# Patient Record
Sex: Female | Born: 1985
Health system: Southern US, Community
[De-identification: ages and names within clinical notes are randomized; demographics above are authoritative.]

## PROBLEM LIST (undated history)

## (undated) ENCOUNTER — Inpatient Hospital Stay (HOSPITAL_COMMUNITY): Payer: Self-pay

## (undated) DIAGNOSIS — N390 Urinary tract infection, site not specified: Secondary | ICD-10-CM

## (undated) DIAGNOSIS — K219 Gastro-esophageal reflux disease without esophagitis: Secondary | ICD-10-CM

## (undated) DIAGNOSIS — IMO0002 Reserved for concepts with insufficient information to code with codable children: Secondary | ICD-10-CM

## (undated) DIAGNOSIS — O139 Gestational [pregnancy-induced] hypertension without significant proteinuria, unspecified trimester: Secondary | ICD-10-CM

## (undated) DIAGNOSIS — T148XXA Other injury of unspecified body region, initial encounter: Secondary | ICD-10-CM

## (undated) DIAGNOSIS — R51 Headache: Secondary | ICD-10-CM

## (undated) DIAGNOSIS — R011 Cardiac murmur, unspecified: Secondary | ICD-10-CM

## (undated) HISTORY — PX: OTHER SURGICAL HISTORY: SHX169

---

## 2005-12-04 ENCOUNTER — Ambulatory Visit (HOSPITAL_COMMUNITY): Admission: RE | Admit: 2005-12-04 | Discharge: 2005-12-04 | Payer: Self-pay | Admitting: Family Medicine

## 2005-12-10 ENCOUNTER — Ambulatory Visit (HOSPITAL_COMMUNITY): Admission: RE | Admit: 2005-12-10 | Discharge: 2005-12-10 | Payer: Self-pay | Admitting: Family Medicine

## 2006-02-11 ENCOUNTER — Encounter (HOSPITAL_COMMUNITY): Admission: RE | Admit: 2006-02-11 | Discharge: 2006-03-13 | Payer: Self-pay | Admitting: Neurosurgery

## 2006-03-16 ENCOUNTER — Encounter (HOSPITAL_COMMUNITY): Admission: RE | Admit: 2006-03-16 | Discharge: 2006-04-15 | Payer: Self-pay | Admitting: Neurosurgery

## 2006-10-01 ENCOUNTER — Emergency Department (HOSPITAL_COMMUNITY): Admission: EM | Admit: 2006-10-01 | Discharge: 2006-10-01 | Payer: Self-pay | Admitting: Emergency Medicine

## 2007-06-15 ENCOUNTER — Ambulatory Visit (HOSPITAL_COMMUNITY): Admission: RE | Admit: 2007-06-15 | Discharge: 2007-06-15 | Payer: Self-pay | Admitting: *Deleted

## 2007-07-04 ENCOUNTER — Emergency Department (HOSPITAL_COMMUNITY): Admission: EM | Admit: 2007-07-04 | Discharge: 2007-07-04 | Payer: Self-pay | Admitting: Emergency Medicine

## 2008-09-02 ENCOUNTER — Emergency Department (HOSPITAL_COMMUNITY): Admission: EM | Admit: 2008-09-02 | Discharge: 2008-09-02 | Payer: Self-pay | Admitting: Emergency Medicine

## 2010-02-23 ENCOUNTER — Encounter: Payer: Self-pay | Admitting: Family Medicine

## 2010-05-09 ENCOUNTER — Other Ambulatory Visit (HOSPITAL_COMMUNITY): Payer: Self-pay | Admitting: Emergency Medicine

## 2010-05-09 ENCOUNTER — Ambulatory Visit (HOSPITAL_COMMUNITY)
Admission: RE | Admit: 2010-05-09 | Discharge: 2010-05-09 | Disposition: A | Discharge status: Home / Self Care | Payer: 59 | Source: Ambulatory Visit | Attending: Emergency Medicine | Admitting: Emergency Medicine

## 2010-05-09 DIAGNOSIS — S99919A Unspecified injury of unspecified ankle, initial encounter: Secondary | ICD-10-CM | POA: Insufficient documentation

## 2010-05-09 DIAGNOSIS — X500XXA Overexertion from strenuous movement or load, initial encounter: Secondary | ICD-10-CM | POA: Insufficient documentation

## 2010-05-09 DIAGNOSIS — S8990XA Unspecified injury of unspecified lower leg, initial encounter: Secondary | ICD-10-CM | POA: Insufficient documentation

## 2010-05-09 DIAGNOSIS — S99912A Unspecified injury of left ankle, initial encounter: Secondary | ICD-10-CM

## 2010-05-09 DIAGNOSIS — M25579 Pain in unspecified ankle and joints of unspecified foot: Secondary | ICD-10-CM | POA: Insufficient documentation

## 2010-05-11 LAB — MONONUCLEOSIS SCREEN: Mono Screen: NEGATIVE

## 2010-05-11 LAB — RAPID STREP SCREEN (MED CTR MEBANE ONLY): Streptococcus, Group A Screen (Direct): NEGATIVE

## 2010-10-29 LAB — RPR: RPR: NONREACTIVE

## 2010-10-29 LAB — ABO/RH: RH Type: POSITIVE

## 2010-10-29 LAB — HEPATITIS B SURFACE ANTIGEN: Hepatitis B Surface Ag: NEGATIVE

## 2010-10-29 LAB — ANTIBODY SCREEN: Antibody Screen: NEGATIVE

## 2011-04-16 ENCOUNTER — Encounter (HOSPITAL_COMMUNITY): Payer: Self-pay | Admitting: *Deleted

## 2011-04-16 ENCOUNTER — Inpatient Hospital Stay (HOSPITAL_COMMUNITY)
Admission: AD | Admit: 2011-04-16 | Discharge: 2011-04-16 | Disposition: A | Discharge status: Home / Self Care | Payer: 59 | Source: Ambulatory Visit | Attending: Obstetrics and Gynecology | Admitting: Obstetrics and Gynecology

## 2011-04-16 DIAGNOSIS — I10 Essential (primary) hypertension: Secondary | ICD-10-CM

## 2011-04-16 DIAGNOSIS — O139 Gestational [pregnancy-induced] hypertension without significant proteinuria, unspecified trimester: Secondary | ICD-10-CM | POA: Insufficient documentation

## 2011-04-16 HISTORY — DX: Reserved for concepts with insufficient information to code with codable children: IMO0002

## 2011-04-16 HISTORY — DX: Gestational (pregnancy-induced) hypertension without significant proteinuria, unspecified trimester: O13.9

## 2011-04-16 LAB — CBC
Hemoglobin: 10.9 g/dL — ABNORMAL LOW (ref 12.0–15.0)
MCHC: 32.7 g/dL (ref 30.0–36.0)

## 2011-04-16 LAB — COMPREHENSIVE METABOLIC PANEL
ALT: 14 U/L (ref 0–35)
AST: 11 U/L (ref 0–37)
Albumin: 2.3 g/dL — ABNORMAL LOW (ref 3.5–5.2)
Alkaline Phosphatase: 85 U/L (ref 39–117)
GFR calc Af Amer: >90 mL/min (ref >90)
Glucose, Bld: 113 mg/dL — ABNORMAL HIGH (ref 70–99)
Potassium: 3.4 mEq/L — ABNORMAL LOW (ref 3.5–5.1)
Sodium: 133 mEq/L — ABNORMAL LOW (ref 135–145)
Total Protein: 6 g/dL (ref 6.0–8.3)

## 2011-04-16 NOTE — MAU Note (Addendum)
rtn visit in office, BP higher today. Was up last wk, had bloodwork for pre-eclampsia done then.  Sent from office.  Denies headache, visual changes, epigastric pain,has noted an increase in swelling in feet.

## 2011-04-16 NOTE — MAU Note (Signed)
Dr. Henderson Cloud notified of pt's lab results, efm tracing reactive, bp's wnl, orders to d/c home, f/u in office Friday or Monday. Pt to be out of work on modified bedrest.

## 2011-04-16 NOTE — Discharge Instructions (Signed)
Dr. Henderson Cloud wants you to be out of work on Moderate bedrest, lying on your left side.      Normal Labor and Delivery Your caregiver must first be sure you are in labor. Signs of labor include:  You may pass what is called "the mucus plug" before labor begins. This is a small amount of blood stained mucus.   Regular uterine contractions.   The time between contractions get closer together.   The discomfort and pain gradually gets more intense.   Pains are mostly located in the back.   Pains get worse when walking.   The cervix (the opening of the uterus becomes thinner (begins to efface) and opens up (dilates).  Once you are in labor and admitted into the hospital or care center, your caregiver will do the following:  A complete physical examination.   Check your vital signs (blood pressure, pulse, temperature and the fetal heart rate).   Do a vaginal examination (using a sterile glove and lubricant) to determine:   The position (presentation) of the baby (head [vertex] or buttock first).   The level (station) of the baby's head in the birth canal.   The effacement and dilatation of the cervix.   You may have your pubic hair shaved and be given an enema depending on your caregiver and the circumstance.   An electronic monitor is usually placed on your abdomen. The monitor follows the length and intensity of the contractions, as well as the baby's heart rate.   Usually, your caregiver will insert an IV in your arm with a bottle of sugar water. This is done as a precaution so that medications can be given to you quickly during labor or delivery.  NORMAL LABOR AND DELIVERY IS DIVIDED UP INTO 3 STAGES: First Stage This is when regular contractions begin and the cervix begins to efface and dilate. This stage can last from 3 to 15 hours. The end of the first stage is when the cervix is 100% effaced and 10 centimeters dilated. Pain medications may be given by   Injection  (morphine, demerol, etc.)   Regional anesthesia (spinal, caudal or epidural, anesthetics given in different locations of the spine). Paracervical pain medication may be given, which is an injection of and anesthetic on each side of the cervix.  A pregnant woman may request to have "Natural Childbirth" which is not to have any medications or anesthesia during her labor and delivery. Second Stage This is when the baby comes down through the birth canal (vagina) and is born. This can take 1 to 4 hours. As the baby's head comes down through the birth canal, you may feel like you are going to have a bowel movement. You will get the urge to bear down and push until the baby is delivered. As the baby's head is being delivered, the caregiver will decide if an episiotomy (a cut in the perineum and vagina area) is needed to prevent tearing of the tissue in this area. The episiotomy is sewn up after the delivery of the baby and placenta. Sometimes a mask with nitrous oxide is given for the mother to breath during the delivery of the baby to help if there is too much pain. The end of Stage 2 is when the baby is fully delivered. Then when the umbilical cord stops pulsating it is clamped and cut. Third Stage The third stage begins after the baby is completely delivered and ends after the placenta (afterbirth) is delivered. This usually  takes 5 to 30 minutes. After the placenta is delivered, a medication is given either by intravenous or injection to help contract the uterus and prevent bleeding. The third stage is not painful and pain medication is usually not necessary. If an episiotomy was done, it is repaired at this time. After the delivery, the mother is watched and monitored closely for 1 to 2 hours to make sure there is no postpartum bleeding (hemorrhage). If there is a lot of bleeding, medication is given to contract the uterus and stop the bleeding. Document Released: 10/29/2007 Document Revised: 01/08/2011  Document Reviewed: 10/29/2007 [facility] Patient Information 2012 [place], [place].Hypertension During Pregnancy Hypertension is also called high blood pressure. It can occur at any time in life and during pregnancy. When you have hypertension, there is extra pressure inside your blood vessels that carry blood from the heart to the rest of your body (arteries). Hypertension during pregnancy can cause problems for you and your baby. Your baby might not weigh as much as it should at birth or might be born early (premature). Very bad cases of hypertension during pregnancy can be life-threatening.  There are different types of hypertension during pregnancy.   Chronic hypertension. This happens when a woman has hypertension before pregnancy and it continues during pregnancy.   Gestational hypertension. This is when hypertension develops during pregnancy.   Preeclampsia or toxemia of pregnancy. This is a very serious type of hypertension that develops only during pregnancy. It is a disease that affects the whole body (systemic) and can be very dangerous for both mother and baby.   Gestational hypertension and preeclampsia usually go away after your baby is born. Blood pressure generally stabilizes within 6 weeks. Women who have hypertension during pregnancy have a greater chance of developing hypertension later in life or with future pregnancies. UNDERSTANDING BLOOD PRESSURE Blood pressure moves blood in your body. Sometimes, the force that moves the blood becomes too strong.  A blood pressure reading is given in 2 numbers and looks like a fraction.   The top number is called the systolic pressure. When your heart beats, it forces more blood to flow through the arteries. Pressure inside the arteries goes up.   The bottom number is the diastolic pressure. Pressure goes down between beats. That is when the heart is resting.   You may have hypertension if:   Your systolic blood pressure is above 140.    Your diastolic pressure is above 90.  RISK FACTORS Some factors make you more likely to develop hypertension during pregnancy. Risk factors include:  Having hypertension before pregnancy.   Having hypertension during a previous pregnancy.   Being overweight.   Being older than 40.   Being pregnant with more than 1 baby (multiples).   Having diabetes or kidney problems.  SYMPTOMS Chronic and gestational hypertension may not cause symptoms. Preeclampsia has symptoms, which may include:  Increased protein in your urine. Your caregiver will check for this at every prenatal visit.   Swelling of your hands and face.   Rapid weight gain.   Headaches.   Visual changes.   Being bothered by light.   Abdominal pain, especially in the right upper area.   Chest pain.   Shortness of breath.   Increased reflexes.   Seizures. Seizures occur with a more severe form of preeclampsia, called eclampsia.  DIAGNOSIS   You may be diagnosed with hypertension during pregnancy during a regular prenatal exam. At each visit, tests may include:   Blood  pressure checks.   A urine test to check for protein in your urine.   The type of hypertension you are diagnosed with depends on when you developed it. It also depends on your specific blood pressure reading.   Developing hypertension before 20 weeks of pregnancy is consistent with chronic hypertension.   Developing hypertension after 20 weeks of pregnancy is consistent with gestational hypertension.   Hypertension with increased urinary protein is diagnosed as preeclampsia.   Blood pressure measurements that stay above 160 systolic or 110 diastolic are a sign of severe preeclampsia.  TREATMENT Treatment for hypertension during pregnancy varies. Treatment depends on the type of hypertension and how serious it is.  If you take medicine for chronic hypertension, you may need to switch medicines.   Drugs called ACE inhibitors should not  be taken during pregnancy.   Low-dose aspirin may be suggested for women who have risk factors for preeclampsia.   If you have gestational hypertension, you may need to take a blood pressure medicine that is safe during pregnancy. Your caregiver will recommend the appropriate medicine.   If you have severe preeclampsia, you may need to be in the hospital. Caregivers will watch you and the baby very closely. You also may need to take medicine (magnesium sulfate) to prevent seizures and lower blood pressure.   Sometimes an early delivery is needed. This may be the case if the condition worsens. It would be done to protect you and the baby. The only cure for preeclampsia is delivery.  HOME CARE INSTRUCTIONS  Schedule and keep all of your regular prenatal care.   Follow your caregiver's instructions for taking medicines. Tell your caregiver about all medicines you take. This includes over-the-counter medicines.   Eat as little salt as possible.   Get regular exercise.   Do not drink alcohol.   Do not use tobacco products.   Do not drink products with caffeine.   Lie on your left side when resting.   Tell your doctor if you have any preeclampsia symptoms.  SEEK IMMEDIATE MEDICAL CARE IF:  You have severe abdominal pain.   You have sudden swelling in the hands, ankles, or face.   You gain 4 pounds (1.8 kg) or more in 1 week.   You vomit repeatedly.   You have vaginal bleeding.   You do not feel the baby moving as much.   You have a headache.   You have blurred or double vision.   You have muscle twitching or spasms.   You have shortness of breath.   You have blue fingernails and lips.   You have blood in your urine.  MAKE SURE YOU:  Understand these instructions.   Will watch your condition.   Will get help right away if you are not doing well.  Document Released: 10/07/2010 Document Revised: 01/08/2011 Document Reviewed: 10/07/2010 [facility] Patient Information  2012 [place], [place].

## 2011-04-16 NOTE — MAU Provider Note (Signed)
History     CSN: [id number]  Arrival date and time: 04/16/11 4098   None     Chief Complaint  Patient presents with  . Hypertension   HPI This is a 26 year old G1 P0 at 83 weeks and one-day who is seen in the MAU for concerns of hypertension and preeclampsia. The patient was evaluated at her office earlier today. The patient admits to tear 4 days of intermittent scotoma and nausea. She is also had increased edema in her lower extremities bilaterally. She denies headache, right upper quadrant pain, decreased fetal activity, vaginal bleeding, vaginal discharge, leaking fluid.  OB History    Grav Para Term Preterm Abortions TAB SAB Ect Mult Living   1               No past medical history on file.  No past surgical history on file.  No family history on file.  History  Substance Use Topics  . Smoking status: Not on file  . Smokeless tobacco: Not on file  . Alcohol Use: Not on file    Allergies: No Known Allergies  Prescriptions prior to admission  Medication Sig Dispense Refill  . OVER THE COUNTER MEDICATION Take 1 tablet by mouth daily. Patient takes Zantac over the counter      . Prenatal Vit-Fe Fumarate-FA (PRENATAL MULTIVITAMIN) TABS Take 1 tablet by mouth daily.        Review of Systems  All other systems reviewed and are negative.   Physical Exam   Blood pressure 170/94, pulse 97, temperature 98.3 F (36.8 C), temperature source Oral, resp. rate 20, weight 182.346 kg (402 lb), last menstrual period 04/17/2010.  Physical Exam  Constitutional: She is oriented to person, place, and time. She appears well-developed and well-nourished.  HENT:  Head: Normocephalic and atraumatic.  Eyes: Conjunctivae and EOM are normal. Pupils are equal, round, and reactive to light.  Neck: Normal range of motion.  Cardiovascular: Normal rate, regular rhythm and normal heart sounds.   Respiratory: Effort normal and breath sounds normal. No respiratory distress. She has no  wheezes. She has no rales. She exhibits no tenderness.  GI: Soft. Bowel sounds are normal. She exhibits no distension and no mass. There is no tenderness. There is no rebound and no guarding.       Obese. Fundal height at term.  Musculoskeletal: She exhibits edema (2+ edema to calf.).  Neurological: She is alert and oriented to person, place, and time.  Skin: Skin is warm and dry.  Psychiatric: She has a normal mood and affect. Her behavior is normal. Judgment and thought content normal.   Lab Results  Component Value Date   WBC 11.3* 04/16/2011   HGB 10.9* 04/16/2011   HCT 33.3* 04/16/2011   MCV 83.9 04/16/2011   PLT 322 04/16/2011   Lab Results  Component Value Date   CREATININE 0.62 04/16/2011   BUN 8 04/16/2011   NA 133* 04/16/2011   K 3.4* 04/16/2011   CL 104 04/16/2011   CO2 19 04/16/2011   Lab Results  Component Value Date   ALT 14 04/16/2011   AST 11 04/16/2011   ALKPHOS 85 04/16/2011   BILITOT 0.2* 04/16/2011   Uric acid 3.3 LDH 133  NST shows baseline rate of 140 with a category 1 tracing including multiple accelerations. There no contractions seen on tocometry.  MAU Course  Procedures  MDM  Assessment and Plan  1.  Hypertension Discussed pt with Dr Henderson Cloud.  Will send patient home  as BP has returned to normal and lab testing normal.  Patient to follow up in 1-2 days at [facility] office.  Will give bedrest and patient to be out of work until released by physician.  Tyrick Dunagan JEHIEL 04/16/2011, 11:37 AM

## 2011-04-16 NOTE — MAU Note (Signed)
Pt seen at MD office this am, elevated bp's. +FM, denies bleeding or lof. Cervix closed in office as well.

## 2011-04-20 ENCOUNTER — Inpatient Hospital Stay (HOSPITAL_COMMUNITY)
Admission: AD | Admit: 2011-04-20 | Discharge: 2011-04-20 | Disposition: A | Discharge status: Home / Self Care | Payer: 59 | Source: Ambulatory Visit | Attending: Obstetrics and Gynecology | Admitting: Obstetrics and Gynecology

## 2011-04-20 ENCOUNTER — Encounter (HOSPITAL_COMMUNITY): Payer: Self-pay | Admitting: *Deleted

## 2011-04-20 DIAGNOSIS — O368190 Decreased fetal movements, unspecified trimester, not applicable or unspecified: Secondary | ICD-10-CM | POA: Insufficient documentation

## 2011-04-20 DIAGNOSIS — O169 Unspecified maternal hypertension, unspecified trimester: Secondary | ICD-10-CM

## 2011-04-20 DIAGNOSIS — O139 Gestational [pregnancy-induced] hypertension without significant proteinuria, unspecified trimester: Secondary | ICD-10-CM | POA: Insufficient documentation

## 2011-04-20 HISTORY — DX: Urinary tract infection, site not specified: N39.0

## 2011-04-20 HISTORY — DX: Cardiac murmur, unspecified: R01.1

## 2011-04-20 HISTORY — DX: Other injury of unspecified body region, initial encounter: T14.8XXA

## 2011-04-20 LAB — COMPREHENSIVE METABOLIC PANEL
Alkaline Phosphatase: 91 U/L (ref 39–117)
BUN: 9 mg/dL (ref 6–23)
CO2: 23 mEq/L (ref 19–32)
Chloride: 103 mEq/L (ref 96–112)
GFR calc Af Amer: >90 mL/min (ref >90)
GFR calc non Af Amer: >90 mL/min (ref >90)
Glucose, Bld: 91 mg/dL (ref 70–99)
Potassium: 4 mEq/L (ref 3.5–5.1)
Total Bilirubin: 0.2 mg/dL — ABNORMAL LOW (ref 0.3–1.2)

## 2011-04-20 LAB — CBC
HCT: 35.6 % — ABNORMAL LOW (ref 36.0–46.0)
Hemoglobin: 11.5 g/dL — ABNORMAL LOW (ref 12.0–15.0)
MCH: 27.4 pg (ref 26.0–34.0)
MCHC: 32.3 g/dL (ref 30.0–36.0)
MCV: 85 fL (ref 78.0–100.0)
Platelets: 334 10*3/uL (ref 150–400)
RBC: 4.19 MIL/uL (ref 3.87–5.11)
RDW: 15.4 % (ref 11.5–15.5)
WBC: 12 10*3/uL — ABNORMAL HIGH (ref 4.0–10.5)

## 2011-04-20 LAB — URIC ACID: Uric Acid, Serum: 3 mg/dL (ref 2.4–7.0)

## 2011-04-20 NOTE — Discharge Instructions (Signed)

## 2011-04-20 NOTE — MAU Note (Signed)
Patient states she was sent from the office for evaluation of elevated blood pressure and decreased fetal movement. Patient states she has a slight headache. Has felt movement, just less than usual for the past 2 days. No bleeding or leaking and no contractions.

## 2011-04-20 NOTE — MAU Provider Note (Signed)
History     CSN: [id number]  Arrival date and time: 04/20/11 1146   First Provider Initiated Contact with Patient 04/20/11 1213      Chief Complaint  Patient presents with  . Hypertension   HPI  Nancy Hayden is 26 y.o. G1P0000 [redacted]w[redacted]d weeks presenting with elevated blood pressure and decreased fetal movement.  She was seen in the office today and sent here for monitoring and PIH labs.  She was also seen last week in MAU for same sxs.  States her blood pressures began being elevated last week.  Dr. Rana Snare asked that the NP see the patient.   Past Medical History  Diagnosis Date  . Pregnancy induced hypertension   . Herniated disc     L4, L5    Past Surgical History  Procedure Date  . No past surgeries     Family History  Problem Relation Age of Onset  . Anesthesia problems Neg Hx   . Hypotension Neg Hx   . Malignant hyperthermia Neg Hx   . Pseudochol deficiency Neg Hx     History  Substance Use Topics  . Smoking status: Not on file  . Smokeless tobacco: Not on file  . Alcohol Use:     Allergies: No Known Allergies  Prescriptions prior to admission  Medication Sig Dispense Refill  . OVER THE COUNTER MEDICATION Take 1 tablet by mouth daily. Patient takes Zantac over the counter      . Prenatal Vit-Fe Fumarate-FA (PRENATAL MULTIVITAMIN) TABS Take 1 tablet by mouth daily.        Review of Systems  Eyes: Negative for blurred vision.  Cardiovascular: Negative for chest pain.       Elevated BP  Genitourinary:       Neg for vaginal bleeding or discharge.  Decreased fetal movement.  Neurological: Negative for headaches.   Physical Exam   Blood pressure 150/96, pulse 85, temperature 97.8 F (36.6 C), temperature source Oral, resp. rate 20, last menstrual period 04/17/2010, SpO2 100.00%.  Temp:  [97.8 F (36.6 C)] 97.8 F (36.6 C) (03/18 1157) Pulse Rate:  [85-106] 96  (03/18 1302) Resp:  [20] 20  (03/18 1228) BP: (136-150)/(66-96) 144/89 mmHg (03/18  1302) SpO2:  [100 %] 100 % (03/18 1157)  Physical Exam  Constitutional: She is oriented to person, place, and time. She appears well-developed and well-nourished. No distress.  HENT:  Head: Normocephalic.  Cardiovascular: Normal rate.   Respiratory: No respiratory distress.  Neurological: She is alert and oriented to person, place, and time.  Skin: Skin is warm and dry.  Psychiatric: She has a normal mood and affect. Her behavior is normal. Judgment and thought content normal.    Results for orders placed during the hospital encounter of 04/20/11 (from the past 24 hour(s))  COMPREHENSIVE METABOLIC PANEL     Status: Abnormal   Collection Time   04/20/11 12:15 PM      Component Value Range   Sodium 136  135 - 145 (mEq/L)   Potassium 4.0  3.5 - 5.1 (mEq/L)   Chloride 103  96 - 112 (mEq/L)   CO2 23  19 - 32 (mEq/L)   Glucose, Bld 91  70 - 99 (mg/dL)   BUN 9  6 - 23 (mg/dL)   Creatinine, Ser 7.82  0.50 - 1.10 (mg/dL)   Calcium 95.6  8.4 - 10.5 (mg/dL)   Total Protein 6.4  6.0 - 8.3 (g/dL)   Albumin 2.5 (*) 3.5 - 5.2 (g/dL)  AST 11  0 - 37 (U/L)   ALT 14  0 - 35 (U/L)   Alkaline Phosphatase 91  39 - 117 (U/L)   Total Bilirubin 0.2 (*) 0.3 - 1.2 (mg/dL)   GFR calc non Af Amer >90  >90 (mL/min)   GFR calc Af Amer >90  >90 (mL/min)  CBC     Status: Abnormal   Collection Time   04/20/11 12:15 PM      Component Value Range   WBC 12.0 (*) 4.0 - 10.5 (K/uL)   RBC 4.19  3.87 - 5.11 (MIL/uL)   Hemoglobin 11.5 (*) 12.0 - 15.0 (g/dL)   HCT 27.2 (*) 53.6 - 46.0 (%)   MCV 85.0  78.0 - 100.0 (fL)   MCH 27.4  26.0 - 34.0 (pg)   MCHC 32.3  30.0 - 36.0 (g/dL)   RDW 64.4  03.4 - 74.2 (%)   Platelets 334  150 - 400 (K/uL)  URIC ACID     Status: Normal   Collection Time   04/20/11 12:15 PM      Component Value Range   Uric Acid, Serum 3.0  2.4 - 7.0 (mg/dL)   MAU Course  Procedures  MDM 13:24  Reported labs and FMS to Dr. Rana Snare.  May discharge to home.  Keep scheduled appt in the office  for 3/21 with Dr. Vincente Poli  Assessment and Plan  A:  Gestational hypertension      PIH ruled out  P:  Keep scheduled appointment in the office for 3/21 with Dr. Vincente Poli.   Reports any further sxs to her doctor.   Angila Wombles,EVE M 04/20/2011, 12:14 PM

## 2011-04-20 NOTE — MAU Note (Signed)
Sent from office, BP elevation. Has been elevated past couple wks.

## 2011-04-28 ENCOUNTER — Encounter (HOSPITAL_COMMUNITY)
Admission: RE | Admit: 2011-04-28 | Discharge: 2011-04-28 | Disposition: A | Discharge status: Home / Self Care | Payer: 59 | Source: Ambulatory Visit | Attending: Obstetrics and Gynecology | Admitting: Obstetrics and Gynecology

## 2011-04-28 ENCOUNTER — Encounter (HOSPITAL_COMMUNITY): Payer: Self-pay

## 2011-04-28 HISTORY — DX: Gastro-esophageal reflux disease without esophagitis: K21.9

## 2011-04-28 HISTORY — DX: Headache: R51

## 2011-04-28 LAB — CBC
MCH: 27.8 pg (ref 26.0–34.0)
MCHC: 32.8 g/dL (ref 30.0–36.0)
RDW: 15.4 % (ref 11.5–15.5)

## 2011-04-28 LAB — SURGICAL PCR SCREEN
MRSA, PCR: NEGATIVE
Staphylococcus aureus: NEGATIVE

## 2011-04-28 LAB — RPR: RPR Ser Ql: NONREACTIVE

## 2011-04-28 NOTE — Patient Instructions (Signed)
YOUR PROCEDURE IS SCHEDULED ON:05/08/11  ENTER THROUGH THE MAIN ENTRANCE OF [facility] AT: 0630 am  USE DESK PHONE AND DIAL [id number] TO INFORM us OF YOUR ARRIVAL  CALL [phone] IF YOU HAVE ANY QUESTIONS OR PROBLEMS PRIOR TO YOUR ARRIVAL.  REMEMBER: DO NOT EAT OR DRINK AFTER MIDNIGHT : Thursday  SPECIAL INSTRUCTIONS:water ok until 4am Friday   YOU MAY BRUSH YOUR TEETH THE MORNING OF SURGERY   TAKE THESE MEDICINES THE DAY OF SURGERY WITH SIP OF WATER:none   DO NOT WEAR JEWELRY, EYE MAKEUP, LIPSTICK OR DARK FINGERNAIL POLISH DO NOT WEAR LOTIONS  DO NOT SHAVE FOR 48 HOURS PRIOR TO SURGERY  YOU WILL NOT BE ALLOWED TO DRIVE YOURSELF HOME.  NAME OF DRIVER: Sharlet Salina

## 2011-04-28 NOTE — OR Nursing (Signed)
This case scheduled at 0800 due to the number of anesthesiologist present this day as the pt is over 40 BMI  surgeon needs an Geophysicist/field seismologist. This case was posted with Dr Roni Bread OK

## 2011-05-05 NOTE — H&P (Addendum)
26 yo G1 @ 39+ weeks presents for primary c-section.  Pt requests primary c-section because of LGA.  Last EFW >4100gms at 38wks.  Good FM.  1hr GTT elevated but 3hr GTT wnl.  Pregnancy uncomplicated.  Past History - See hollister, NKDA  AF, VSS  BP 140-150/90x Gen - NAD Abd - gravid, obese, NT Ext - traced edema bilaterally Cvx 1cm  A/P;  R/b/a to primary c-section vs TOL discussed and informed consent obtained.  Risks discussed again with pt and husband.  Questions answered.  Informed consent

## 2011-05-07 MED ORDER — CEFAZOLIN SODIUM-DEXTROSE 2-3 GM-% IV SOLR
2.0000 g | INTRAVENOUS | Status: AC
  Administered 2011-05-08: 2 g via INTRAVENOUS
  Filled 2011-05-07: qty 50

## 2011-05-08 ENCOUNTER — Encounter (HOSPITAL_COMMUNITY): Payer: Self-pay | Admitting: *Deleted

## 2011-05-08 ENCOUNTER — Inpatient Hospital Stay (HOSPITAL_COMMUNITY)
Admission: RE | Admit: 2011-05-08 | Discharge: 2011-05-11 | DRG: 766 | Disposition: A | Discharge status: Home / Self Care | Payer: 59 | Source: Ambulatory Visit | Attending: Obstetrics and Gynecology | Admitting: Obstetrics and Gynecology

## 2011-05-08 ENCOUNTER — Encounter (HOSPITAL_COMMUNITY)
Admission: RE | Disposition: A | Discharge status: Home / Self Care | Payer: Self-pay | Source: Ambulatory Visit | Attending: Obstetrics and Gynecology

## 2011-05-08 ENCOUNTER — Encounter (HOSPITAL_COMMUNITY): Payer: Self-pay | Admitting: Anesthesiology

## 2011-05-08 ENCOUNTER — Inpatient Hospital Stay (HOSPITAL_COMMUNITY): Payer: 59 | Admitting: Anesthesiology

## 2011-05-08 DIAGNOSIS — E669 Obesity, unspecified: Secondary | ICD-10-CM | POA: Diagnosis present

## 2011-05-08 DIAGNOSIS — O99214 Obesity complicating childbirth: Secondary | ICD-10-CM | POA: Diagnosis present

## 2011-05-08 DIAGNOSIS — O3660X0 Maternal care for excessive fetal growth, unspecified trimester, not applicable or unspecified: Principal | ICD-10-CM | POA: Diagnosis present

## 2011-05-08 SURGERY — Surgical Case
Anesthesia: Spinal | Site: Abdomen | Wound class: Clean

## 2011-05-08 MED ORDER — NALBUPHINE HCL 10 MG/ML IJ SOLN
5.0000 mg | INTRAMUSCULAR | Status: DC | PRN
  Administered 2011-05-08: 5 mg via SUBCUTANEOUS
  Filled 2011-05-08: qty 1

## 2011-05-08 MED ORDER — OXYTOCIN 20 UNITS IN LACTATED RINGERS INFUSION - SIMPLE
INTRAVENOUS | Status: DC | PRN
  Administered 2011-05-08: 20 [IU] via INTRAVENOUS

## 2011-05-08 MED ORDER — SCOPOLAMINE 1 MG/3DAYS TD PT72
MEDICATED_PATCH | TRANSDERMAL | Status: AC
  Administered 2011-05-08: 1.5 mg via TRANSDERMAL
  Filled 2011-05-08: qty 1

## 2011-05-08 MED ORDER — NALBUPHINE HCL 10 MG/ML IJ SOLN
5.0000 mg | INTRAMUSCULAR | Status: DC | PRN
  Filled 2011-05-08: qty 1

## 2011-05-08 MED ORDER — DEXAMETHASONE SODIUM PHOSPHATE 10 MG/ML IJ SOLN
INTRAMUSCULAR | Status: AC
  Filled 2011-05-08: qty 1

## 2011-05-08 MED ORDER — MORPHINE SULFATE 0.5 MG/ML IJ SOLN
INTRAMUSCULAR | Status: AC
  Filled 2011-05-08: qty 10

## 2011-05-08 MED ORDER — KETOROLAC TROMETHAMINE 60 MG/2ML IM SOLN
60.0000 mg | Freq: Once | INTRAMUSCULAR | Status: AC | PRN
  Administered 2011-05-08: 60 mg via INTRAMUSCULAR

## 2011-05-08 MED ORDER — FENTANYL CITRATE 0.05 MG/ML IJ SOLN
INTRAMUSCULAR | Status: DC | PRN
  Administered 2011-05-08: 25 ug via INTRATHECAL

## 2011-05-08 MED ORDER — LANOLIN HYDROUS EX OINT: 1.0000 "application " | TOPICAL_OINTMENT | CUTANEOUS | Status: DC | PRN

## 2011-05-08 MED ORDER — DIPHENHYDRAMINE HCL 50 MG/ML IJ SOLN: 12.5000 mg | INTRAMUSCULAR | Status: DC | PRN

## 2011-05-08 MED ORDER — OXYTOCIN 20 UNITS IN LACTATED RINGERS INFUSION - SIMPLE
INTRAVENOUS | Status: AC
  Administered 2011-05-08: 125 mL/h via INTRAVENOUS
  Filled 2011-05-08: qty 1000

## 2011-05-08 MED ORDER — MEPERIDINE HCL 25 MG/ML IJ SOLN: 6.2500 mg | INTRAMUSCULAR | Status: DC | PRN

## 2011-05-08 MED ORDER — ONDANSETRON HCL 4 MG/2ML IJ SOLN
INTRAMUSCULAR | Status: AC
  Filled 2011-05-08: qty 2

## 2011-05-08 MED ORDER — OXYCODONE-ACETAMINOPHEN 5-325 MG PO TABS
1.0000 | ORAL_TABLET | ORAL | Status: DC | PRN
  Administered 2011-05-09 – 2011-05-11 (×11): 2 via ORAL
  Filled 2011-05-08 (×11): qty 2

## 2011-05-08 MED ORDER — PANTOPRAZOLE SODIUM 40 MG PO TBEC
DELAYED_RELEASE_TABLET | ORAL | Status: AC
  Administered 2011-05-08: 40 mg via ORAL
  Filled 2011-05-08: qty 1

## 2011-05-08 MED ORDER — HYDROMORPHONE HCL PF 1 MG/ML IJ SOLN
INTRAMUSCULAR | Status: AC
  Administered 2011-05-08: 0.5 mg via INTRAVENOUS
  Filled 2011-05-08: qty 1

## 2011-05-08 MED ORDER — EPHEDRINE 5 MG/ML INJ
INTRAVENOUS | Status: AC
  Filled 2011-05-08: qty 10

## 2011-05-08 MED ORDER — DIPHENHYDRAMINE HCL 50 MG/ML IJ SOLN: 25.0000 mg | INTRAMUSCULAR | Status: DC | PRN

## 2011-05-08 MED ORDER — MEDROXYPROGESTERONE ACETATE 150 MG/ML IM SUSP: 150.0000 mg | INTRAMUSCULAR | Status: DC | PRN

## 2011-05-08 MED ORDER — SIMETHICONE 80 MG PO CHEW: 80.0000 mg | CHEWABLE_TABLET | ORAL | Status: DC | PRN

## 2011-05-08 MED ORDER — SENNOSIDES-DOCUSATE SODIUM 8.6-50 MG PO TABS
2.0000 | ORAL_TABLET | Freq: Every day | ORAL | Status: DC
  Administered 2011-05-08 – 2011-05-10 (×3): 2 via ORAL

## 2011-05-08 MED ORDER — MENTHOL 3 MG MT LOZG: 1.0000 | LOZENGE | OROMUCOSAL | Status: DC | PRN

## 2011-05-08 MED ORDER — SODIUM CHLORIDE 0.9 % IJ SOLN: 3.0000 mL | INTRAMUSCULAR | Status: DC | PRN

## 2011-05-08 MED ORDER — ONDANSETRON HCL 4 MG/2ML IJ SOLN
INTRAMUSCULAR | Status: DC | PRN
  Administered 2011-05-08: 4 mg via INTRAVENOUS

## 2011-05-08 MED ORDER — SCOPOLAMINE 1 MG/3DAYS TD PT72
1.0000 | MEDICATED_PATCH | TRANSDERMAL | Status: DC
  Administered 2011-05-08: 1.5 mg via TRANSDERMAL

## 2011-05-08 MED ORDER — OXYTOCIN 20 UNITS IN LACTATED RINGERS INFUSION - SIMPLE
125.0000 mL/h | INTRAVENOUS | Status: AC
  Administered 2011-05-08: 125 mL/h via INTRAVENOUS

## 2011-05-08 MED ORDER — MORPHINE SULFATE (PF) 0.5 MG/ML IJ SOLN
INTRAMUSCULAR | Status: DC | PRN
  Administered 2011-05-08: .15 mg via EPIDURAL

## 2011-05-08 MED ORDER — OXYTOCIN 10 UNIT/ML IJ SOLN
INTRAMUSCULAR | Status: AC
  Filled 2011-05-08: qty 4

## 2011-05-08 MED ORDER — ONDANSETRON HCL 4 MG PO TABS: 4.0000 mg | ORAL_TABLET | ORAL | Status: DC | PRN

## 2011-05-08 MED ORDER — IBUPROFEN 600 MG PO TABS
600.0000 mg | ORAL_TABLET | Freq: Four times a day (QID) | ORAL | Status: DC
  Administered 2011-05-08 – 2011-05-11 (×11): 600 mg via ORAL
  Filled 2011-05-08 (×11): qty 1

## 2011-05-08 MED ORDER — DEXTROSE IN LACTATED RINGERS 5 % IV SOLN
INTRAVENOUS | Status: DC
  Administered 2011-05-08: 16:00:00 via INTRAVENOUS

## 2011-05-08 MED ORDER — ONDANSETRON HCL 4 MG/2ML IJ SOLN: 4.0000 mg | INTRAMUSCULAR | Status: DC | PRN

## 2011-05-08 MED ORDER — TETANUS-DIPHTH-ACELL PERTUSSIS 5-2.5-18.5 LF-MCG/0.5 IM SUSP
0.5000 mL | Freq: Once | INTRAMUSCULAR | Status: AC
  Administered 2011-05-09: 0.5 mL via INTRAMUSCULAR
  Filled 2011-05-08: qty 0.5

## 2011-05-08 MED ORDER — KETOROLAC TROMETHAMINE 30 MG/ML IJ SOLN: 30.0000 mg | Freq: Four times a day (QID) | INTRAMUSCULAR | Status: AC | PRN

## 2011-05-08 MED ORDER — DEXAMETHASONE SODIUM PHOSPHATE 10 MG/ML IJ SOLN
INTRAMUSCULAR | Status: DC | PRN
  Administered 2011-05-08: 10 mg via INTRAVENOUS

## 2011-05-08 MED ORDER — FENTANYL CITRATE 0.05 MG/ML IJ SOLN
INTRAMUSCULAR | Status: AC
  Filled 2011-05-08: qty 2

## 2011-05-08 MED ORDER — KETOROLAC TROMETHAMINE 30 MG/ML IJ SOLN: 15.0000 mg | Freq: Once | INTRAMUSCULAR | Status: DC | PRN

## 2011-05-08 MED ORDER — DIPHENHYDRAMINE HCL 25 MG PO CAPS
25.0000 mg | ORAL_CAPSULE | ORAL | Status: DC | PRN
  Filled 2011-05-08: qty 1

## 2011-05-08 MED ORDER — PHENYLEPHRINE HCL 10 MG/ML IJ SOLN
INTRAMUSCULAR | Status: DC | PRN
  Administered 2011-05-08 (×2): 80 ug via INTRAVENOUS

## 2011-05-08 MED ORDER — KETOROLAC TROMETHAMINE 60 MG/2ML IM SOLN
INTRAMUSCULAR | Status: AC
  Administered 2011-05-08: 60 mg via INTRAMUSCULAR
  Filled 2011-05-08: qty 2

## 2011-05-08 MED ORDER — WITCH HAZEL-GLYCERIN EX PADS: 1.0000 "application " | MEDICATED_PAD | CUTANEOUS | Status: DC | PRN

## 2011-05-08 MED ORDER — ONDANSETRON HCL 4 MG/2ML IJ SOLN: 4.0000 mg | Freq: Three times a day (TID) | INTRAMUSCULAR | Status: DC | PRN

## 2011-05-08 MED ORDER — LACTATED RINGERS IV SOLN
INTRAVENOUS | Status: DC
  Administered 2011-05-08 (×2): via INTRAVENOUS
  Administered 2011-05-08: 125 mL/h via INTRAVENOUS

## 2011-05-08 MED ORDER — NALBUPHINE SYRINGE 5 MG/0.5 ML
INJECTION | INTRAMUSCULAR | Status: AC
  Administered 2011-05-08: 5 mg via SUBCUTANEOUS
  Filled 2011-05-08: qty 0.5

## 2011-05-08 MED ORDER — NALOXONE HCL 0.4 MG/ML IJ SOLN: 0.4000 mg | INTRAMUSCULAR | Status: DC | PRN

## 2011-05-08 MED ORDER — SIMETHICONE 80 MG PO CHEW
80.0000 mg | CHEWABLE_TABLET | Freq: Three times a day (TID) | ORAL | Status: DC
  Administered 2011-05-08 – 2011-05-11 (×11): 80 mg via ORAL

## 2011-05-08 MED ORDER — DIPHENHYDRAMINE HCL 25 MG PO CAPS: 25.0000 mg | ORAL_CAPSULE | Freq: Four times a day (QID) | ORAL | Status: DC | PRN

## 2011-05-08 MED ORDER — PRENATAL MULTIVITAMIN CH
1.0000 | ORAL_TABLET | Freq: Every day | ORAL | Status: DC
  Administered 2011-05-08 – 2011-05-11 (×4): 1 via ORAL
  Filled 2011-05-08 (×4): qty 1

## 2011-05-08 MED ORDER — PROMETHAZINE HCL 25 MG/ML IJ SOLN: 6.2500 mg | INTRAMUSCULAR | Status: DC | PRN

## 2011-05-08 MED ORDER — PANTOPRAZOLE SODIUM 40 MG PO TBEC
40.0000 mg | DELAYED_RELEASE_TABLET | Freq: Once | ORAL | Status: AC
  Administered 2011-05-08: 40 mg via ORAL

## 2011-05-08 MED ORDER — FAMOTIDINE 20 MG PO TABS
20.0000 mg | ORAL_TABLET | Freq: Two times a day (BID) | ORAL | Status: DC
  Administered 2011-05-08 – 2011-05-11 (×5): 20 mg via ORAL
  Filled 2011-05-08 (×5): qty 1

## 2011-05-08 MED ORDER — PHENYLEPHRINE 40 MCG/ML (10ML) SYRINGE FOR IV PUSH (FOR BLOOD PRESSURE SUPPORT)
PREFILLED_SYRINGE | INTRAVENOUS | Status: AC
  Filled 2011-05-08: qty 5

## 2011-05-08 MED ORDER — HYDROMORPHONE HCL PF 1 MG/ML IJ SOLN
0.2500 mg | INTRAMUSCULAR | Status: DC | PRN
  Administered 2011-05-08 (×2): 0.5 mg via INTRAVENOUS

## 2011-05-08 MED ORDER — MEASLES, MUMPS & RUBELLA VAC ~~LOC~~ INJ
0.5000 mL | INJECTION | Freq: Once | SUBCUTANEOUS | Status: DC
  Filled 2011-05-08: qty 0.5

## 2011-05-08 MED ORDER — DIBUCAINE 1 % RE OINT: 1.0000 "application " | TOPICAL_OINTMENT | RECTAL | Status: DC | PRN

## 2011-05-08 MED ORDER — EPHEDRINE SULFATE 50 MG/ML IJ SOLN
INTRAMUSCULAR | Status: DC | PRN
  Administered 2011-05-08 (×2): 10 mg via INTRAVENOUS

## 2011-05-08 MED ORDER — SODIUM CHLORIDE 0.9 % IV SOLN
1.0000 ug/kg/h | INTRAVENOUS | Status: DC | PRN
  Filled 2011-05-08: qty 2.5

## 2011-05-08 SURGICAL SUPPLY — 29 items
CHLORAPREP W/TINT 26ML (MISCELLANEOUS) ×2 IMPLANT
CLOTH BEACON ORANGE TIMEOUT ST (SAFETY) ×2 IMPLANT
DRESSING TELFA 8X3 (GAUZE/BANDAGES/DRESSINGS) ×1 IMPLANT
DRSG COVADERM 4X10 (GAUZE/BANDAGES/DRESSINGS) ×1 IMPLANT
ELECT REM PT RETURN 9FT ADLT (ELECTROSURGICAL) ×2
ELECTRODE REM PT RTRN 9FT ADLT (ELECTROSURGICAL) ×1 IMPLANT
EXTRACTOR VACUUM M CUP 4 TUBE (SUCTIONS) IMPLANT
GLOVE BIO SURGEON STRL SZ 6.5 (GLOVE) ×2 IMPLANT
GLOVE BIOGEL PI IND STRL 7.0 (GLOVE) ×2 IMPLANT
GLOVE BIOGEL PI INDICATOR 7.0 (GLOVE) ×2
GOWN PREVENTION PLUS LG XLONG (DISPOSABLE) ×6 IMPLANT
KIT ABG SYR 3ML LUER SLIP (SYRINGE) ×2 IMPLANT
NDL HYPO 25X5/8 SAFETYGLIDE (NEEDLE) ×1 IMPLANT
NEEDLE HYPO 25X5/8 SAFETYGLIDE (NEEDLE) ×2 IMPLANT
NS IRRIG 1000ML POUR BTL (IV SOLUTION) ×2 IMPLANT
PACK C SECTION WH (CUSTOM PROCEDURE TRAY) ×2 IMPLANT
RTRCTR C-SECT PINK 25CM LRG (MISCELLANEOUS) ×1 IMPLANT
SLEEVE SCD COMPRESS KNEE MED (MISCELLANEOUS) ×1 IMPLANT
STAPLER VISISTAT 35W (STAPLE) ×1 IMPLANT
SUT CHROMIC 0 CT 802H (SUTURE) IMPLANT
SUT CHROMIC 0 CTX 36 (SUTURE) ×5 IMPLANT
SUT MNCRL AB 3-0 PS2 27 (SUTURE) IMPLANT
SUT MON AB-0 CT1 36 (SUTURE) ×2 IMPLANT
SUT PDS AB 0 CTX 60 (SUTURE) ×2 IMPLANT
SUT PLAIN 0 NONE (SUTURE) IMPLANT
SUT PLAIN 2 0 XLH (SUTURE) ×1 IMPLANT
TOWEL OR 17X24 6PK STRL BLUE (TOWEL DISPOSABLE) ×4 IMPLANT
TRAY FOLEY CATH 14FR (SET/KITS/TRAYS/PACK) ×1 IMPLANT
WATER STERILE IRR 1000ML POUR (IV SOLUTION) ×2 IMPLANT

## 2011-05-08 NOTE — Anesthesia Postprocedure Evaluation (Signed)
  Anesthesia Post-op Note  Patient: Nancy Hayden  Procedure(s) Performed: Procedure(s) (LRB): CESAREAN SECTION (N/A)  Patient Location: Mother/Baby  Anesthesia Type: Spinal  Level of Consciousness: awake  Airway and Oxygen Therapy: Patient Spontanous Breathing  Post-op Pain: mild  Post-op Assessment: Patient's Cardiovascular Status Stable and Respiratory Function Stable  Post-op Vital Signs: stable  Complications: No apparent anesthesia complications

## 2011-05-08 NOTE — Consult Note (Signed)
Requested to attend C/S at term gestation for macrosomia. At delivery infant in vertex with loose nuchal cord X 1, spontaneous cries and active tone. Given tactile stimulation with drying and bulb suction to naso/oropharynx. No dysmorphic features.    Shown to parents and then care transferred to RN and to assigned pediatrician.    Nancy Ligas MD [facility]

## 2011-05-08 NOTE — Op Note (Signed)
Cesarean Section Procedure Note   Nancy Hayden  05/08/2011  Indications: morbid obesity, suspect LGA, pt request  Pre-operative Diagnosis: large gestational age, morbid obesity, pt request   Post-operative Diagnosis: Same   Surgeon: Surgeon(s) and Role:    * Zelphia Cairo, MD - Primary      Assistants: Richardean Chimera, MD  Anesthesia: spinal   Procedure Details:  The patient was seen in the Holding Room. The risks, benefits, complications, treatment options, and expected outcomes were discussed with the patient. The patient concurred with the proposed plan, giving informed consent. identified as [name] and the procedure verified as C-Section Delivery. A Time Out was held and the above information confirmed.  After induction of anesthesia, the patient was draped and prepped in the usual sterile manner. A transverse was made and carried down through the subcutaneous tissue to the fascia. Fascial incision was made and extended transversely. The fascia was separated from the underlying rectus tissue superiorly and inferiorly. The peritoneum was identified and entered. Peritoneal incision was extended longitudinally. The utero-vesical peritoneal reflection was incised transversely and the bladder flap was bluntly freed from the lower uterine segment. A low transverse uterine incision was made. Delivered from cephalic presentation was a vigerous female with Apgar scores of 9 at one minute and 9 at five minutes. Cord ph was not sent the umbilical cord was clamped and cut cord blood was obtained for evaluation. The placenta was removed Intact and appeared normal. The uterine outline, tubes and ovaries appeared normal}. The uterine incision was closed with running locked sutures of 0chromic gut.   Hemostasis was observed. Lavage was carried out until clear. Peritoneum was closed with 0 monocryl.  The fascia was then reapproximated with running sutures of 0PDS. The subcuticular closure was  performed using 2-0plain gut. The skin was closed with staples.     Instrument, sponge, and needle counts were correct prior the abdominal closure and were correct at the conclusion of the case.    Findings:   Estimated Blood Loss: * No blood loss amount entered *   Urine Output: 100cc, clear  Specimens: @ORSPECIMEN @   Complications: no complications  Disposition: PACU - hemodynamically stable.   Maternal Condition: stable   Baby condition / location:  nursery-stable  Attending Attestation: I was present and scrubbed for the entire procedure.   Signed: Surgeon(s): Zelphia Cairo, MD Juluis Mire, MD

## 2011-05-08 NOTE — Anesthesia Preprocedure Evaluation (Signed)
Anesthesia Evaluation  Patient identified by MRN, date of birth, ID band Patient awake    Reviewed: Allergy & Precautions, H&P , NPO status , Patient's Chart, lab work & pertinent test results  Airway Mallampati: III TM Distance: >3 FB Neck ROM: full    Dental No notable dental hx.    Pulmonary neg pulmonary ROS,    Pulmonary exam normal       Cardiovascular     Neuro/Psych negative psych ROS   GI/Hepatic Neg liver ROS,   Endo/Other  Morbid obesity  Renal/GU negative Renal ROS  negative genitourinary   Musculoskeletal negative musculoskeletal ROS (+)   Abdominal (+) + obese,   Peds negative pediatric ROS (+)  Hematology negative hematology ROS (+)   Anesthesia Other Findings   Reproductive/Obstetrics (+) Pregnancy                           Anesthesia Physical Anesthesia Plan  ASA: III  Anesthesia Plan: Spinal   Post-op Pain Management:    Induction:   Airway Management Planned:   Additional Equipment:   Intra-op Plan:   Post-operative Plan:   Informed Consent: I have reviewed the patients History and Physical, chart, labs and discussed the procedure including the risks, benefits and alternatives for the proposed anesthesia with the patient or authorized representative who has indicated his/her understanding and acceptance.     Plan Discussed with: CRNA and Surgeon  Anesthesia Plan Comments:         Anesthesia Quick Evaluation

## 2011-05-08 NOTE — Anesthesia Procedure Notes (Signed)

## 2011-05-08 NOTE — Transfer of Care (Signed)
Immediate Anesthesia Transfer of Care Note  Patient: Nancy Hayden  Procedure(s) Performed: Procedure(s) (LRB): CESAREAN SECTION (N/A)  Patient Location: PACU  Anesthesia Type: Spinal  Level of Consciousness: alert  and oriented  Airway & Oxygen Therapy: Patient Spontanous Breathing  Post-op Assessment: Report given to PACU RN and Post -op Vital signs reviewed and stable  Post vital signs: stable  Complications: No apparent anesthesia complications

## 2011-05-09 LAB — CBC
HCT: 29.1 % — ABNORMAL LOW (ref 36.0–46.0)
Hemoglobin: 9.2 g/dL — ABNORMAL LOW (ref 12.0–15.0)
MCV: 86.4 fL (ref 78.0–100.0)
RBC: 3.37 MIL/uL — ABNORMAL LOW (ref 3.87–5.11)
WBC: 13.6 10*3/uL — ABNORMAL HIGH (ref 4.0–10.5)

## 2011-05-09 NOTE — Progress Notes (Signed)
Subjective: Postpartum Day 1: Cesarean Delivery Patient reports tolerating PO and no problems voiding.    Objective: Vital signs in last 24 hours: Temp:  [97.7 F (36.5 C)-99.2 F (37.3 C)] 97.7 F (36.5 C) (04/06 0353) Pulse Rate:  [65-95] 94  (04/06 0353) Resp:  [11-26] 20  (04/06 0353) BP: (105-144)/(49-84) 109/72 mmHg (04/06 0353) SpO2:  [96 %-100 %] 97 % (04/06 0353) Weight:  [182.346 kg (402 lb)] 182.346 kg (402 lb) (04/05 0911)  Physical Exam:  General: alert and cooperative Lochia: appropriate Uterine Fundus: firm Incision: no significant drainage DVT Evaluation: No evidence of DVT seen on physical exam.   Basename 05/09/11 0533  HGB 9.2*  HCT 29.1*    Assessment/Plan: Status post Cesarean section. Doing well postoperatively.  Continue current care.  Deltha Bernales 05/09/2011, 9:00 AM

## 2011-05-10 NOTE — Progress Notes (Signed)
Subjective: Postpartum Day 2: Cesarean Delivery Patient reports tolerating PO, + flatus and no problems voiding.    Objective: Vital signs in last 24 hours: Temp:  [97.8 F (36.6 C)-98.5 F (36.9 C)] 98.5 F (36.9 C) (04/07 0553) Pulse Rate:  [91-99] 91  (04/07 0553) Resp:  [18-20] 20  (04/07 0553) BP: (101-138)/(70-89) 122/82 mmHg (04/07 0553)  Physical Exam:  General: alert and cooperative Lochia: appropriate Uterine Fundus: firm Incision: healing well DVT Evaluation: No evidence of DVT seen on physical exam.   Basename 05/09/11 0533  HGB 9.2*  HCT 29.1*    Assessment/Plan: Status post Cesarean section. Doing well postoperatively.  Continue current care.  Nancy Hayden 05/10/2011, 8:52 AM

## 2011-05-11 ENCOUNTER — Encounter (HOSPITAL_COMMUNITY): Payer: Self-pay | Admitting: Obstetrics and Gynecology

## 2011-05-11 MED ORDER — OXYCODONE-ACETAMINOPHEN 5-325 MG PO TABS: 1.0000 | ORAL_TABLET | ORAL | Status: AC | PRN

## 2011-05-11 MED ORDER — IBUPROFEN 600 MG PO TABS: 600.0000 mg | ORAL_TABLET | Freq: Four times a day (QID) | ORAL | Status: AC

## 2011-05-11 NOTE — Discharge Summary (Signed)
Obstetric Discharge Summary Reason for Admission: cesarean section Prenatal Procedures: ultrasound Intrapartum Procedures: cesarean: low cervical, transverse Postpartum Procedures: none Complications-Operative and Postpartum: none Hemoglobin  Date Value Range Status  05/09/2011 9.2* 12.0-15.0 (g/dL) Final     HCT  Date Value Range Status  05/09/2011 29.1* 36.0-46.0 (%) Final    Physical Exam:  General: alert, cooperative and morbidly obese Lochia: appropriate Uterine Fundus: firm Incision: healing well, staples left in place DVT Evaluation: No evidence of DVT seen on physical exam.  Discharge Diagnoses: Term Pregnancy-delivered  Discharge Information: Date: 05/11/2011 Activity: pelvic rest Diet: routine Medications: PNV, Ibuprofen and Percocet Condition: stable Instructions: refer to practice specific booklet Discharge to: home   Newborn Data: Live born female  Birth Weight: 9 lb 0.6 oz (4100 g) APGAR: 9, 9  Home with mother.  Dorinda Stehr G 05/11/2011, 8:01 AM

## 2011-05-11 NOTE — Progress Notes (Signed)
Subjective: Postpartum Day 3: Cesarean Delivery Patient reports tolerating PO, + flatus and no problems voiding.    Objective: Vital signs in last 24 hours: Temp:  [97.5 F (36.4 C)-98.6 F (37 C)] 98.4 F (36.9 C) (04/08 0619) Pulse Rate:  [88-99] 88  (04/08 0619) Resp:  [18-20] 20  (04/08 0619) BP: (110-127)/(66-81) 110/66 mmHg (04/08 0619) SpO2:  [98 %] 98 % (04/07 1922)  Physical Exam:  General: alert and cooperative Lochia: appropriate Uterine Fundus: firm Incision: staples intact under pannus. Left in place DVT Evaluation: No evidence of DVT seen on physical exam.   Basename 05/09/11 0533  HGB 9.2*  HCT 29.1*    Assessment/Plan: Status post Cesarean section. Doing well postoperatively.  Discharge home with standard precautions and return to clinic in 2-3 days for staple removal.  Debby Clyne G 05/11/2011, 7:55 AM

## 2012-07-03 ENCOUNTER — Inpatient Hospital Stay (HOSPITAL_COMMUNITY)
Admission: AD | Admit: 2012-07-03 | Discharge: 2012-07-03 | Disposition: A | Discharge status: Home / Self Care | Payer: 59 | Source: Ambulatory Visit | Attending: Obstetrics and Gynecology | Admitting: Obstetrics and Gynecology

## 2012-07-03 DIAGNOSIS — Z0000 Encounter for general adult medical examination without abnormal findings: Secondary | ICD-10-CM

## 2012-07-03 DIAGNOSIS — Z711 Person with feared health complaint in whom no diagnosis is made: Secondary | ICD-10-CM | POA: Insufficient documentation

## 2012-07-03 NOTE — MAU Note (Signed)
Patient presents to MAU with c/o possible tampon stuck inside vagina. Reports she inserted tampon at 2200 or 2300 last night; woke up this morning and was bleeding heavier.  Reports she feels something when she shifts weight to left.

## 2012-07-03 NOTE — MAU Provider Note (Signed)
Attestation of Attending Supervision of Advanced Practitioner: Evaluation and management procedures were performed by the PA/NP/CNM/OB Fellow under my supervision/collaboration. Chart reviewed and agree with management and plan.  Bulmaro Feagans V 07/03/2012 6:24 PM

## 2012-07-03 NOTE — MAU Provider Note (Signed)
None     Chief Complaint:  Possible tampon stuck in vagina  Nancy Hayden is  27 y.o. G1P1001.  Patient's last menstrual period was 05/31/2012... Pt put a tampon in last night, and never removed it.  Possibly fell out while using the bathroom this am, but did not see it in toilet.  Has had several random pains in vagina. Concerned tampon may be stuck  Past Medical History  Diagnosis Date  . Herniated disc     L4, L5  . Urinary tract infection     history  . Fractured     rt elbow, rt wrist; rt and left ankle, nose ( h/o )  . Heart murmur     as infant - no problems as adult  . Pregnancy induced hypertension     no meds  . GERD (gastroesophageal reflux disease)     takes tums as needed  . Headache     tx with otc meds prn    Past Surgical History  Procedure Laterality Date  . Right hand surgery      for laceration  . Tubes in ears      as child  . Bladder stretch      as child  . Spinal tap      as child  . Cesarean section  05/08/2011    Procedure: CESAREAN SECTION;  Surgeon: Zelphia Cairo, MD;  Location: [facility];  Service: Gynecology;  Laterality: N/A;  PRIMARY  this case was scheduled with the permission of Dr Malen Gauze due to patient BMI and the need for the surgeon to have an assistant [facility] 05/13/11    Family History  Problem Relation Age of Onset  . Anesthesia problems Neg Hx   . Hypotension Neg Hx   . Malignant hyperthermia Neg Hx   . Pseudochol deficiency Neg Hx   . Hypertension Mother     History  Substance Use Topics  . Smoking status: Former Smoker -- 0.25 packs/day for 3 years    Types: Cigarettes  . Smokeless tobacco: Never Used  . Alcohol Use: Yes     Comment: socially but none with pregnancy    Allergies: No Known Allergies  Prescriptions prior to admission  Medication Sig Dispense Refill  . ibuprofen (ADVIL,MOTRIN) 200 MG tablet Take 800 mg by mouth every 6 (six) hours as needed for pain.         Review of Systems   Constitutional:  Negative for fever and chills Eyes: Negative for visual disturbances Respiratory: Negative for shortness of breath, dyspnea Cardiovascular: Negative for chest pain or palpitations  Gastrointestinal: Negative for vomiting, diarrhea and constipation Genitourinary: Negative for dysuria and urgency Musculoskeletal: Negative for back pain, joint pain, myalgias  Neurological: Negative for dizziness and headaches     Physical Exam   Blood pressure 137/74, pulse 138, temperature 96.8 F (36 C), temperature source Oral, resp. rate 18, height 5\' 9"  (1.753 m), weight 158.759 kg (350 lb), last menstrual period 05/31/2012.  General: General appearance - alert, well appearing, and in no distress Chest - clear to auscultation, no wheezes, rales or rhonchi, symmetric air entry Heart - normal rate and regular rhythm Abdomen - soft, nontender, nondistended, no masses or organomegaly Pelvic - SSE:  Small amount of menstrual blood.  No visible or palpable tampon Extremities - no pedal edema noted   Labs: No results found for this or any previous visit (from the past 24 hour(s)). Imaging Studies:  No results found.   Assessment:  No tampon Plan: D/C home.  Advised that she should wear pads at night  CRESENZO-DISHMAN,Athen Riel

## 2012-12-02 ENCOUNTER — Emergency Department (HOSPITAL_COMMUNITY)
Admission: EM | Admit: 2012-12-02 | Discharge: 2012-12-03 | Disposition: A | Discharge status: Home / Self Care | Payer: 59 | Attending: Emergency Medicine | Admitting: Emergency Medicine

## 2012-12-02 ENCOUNTER — Encounter (HOSPITAL_COMMUNITY): Payer: Self-pay | Admitting: Emergency Medicine

## 2012-12-02 DIAGNOSIS — Z87440 Personal history of urinary (tract) infections: Secondary | ICD-10-CM | POA: Insufficient documentation

## 2012-12-02 DIAGNOSIS — Z8781 Personal history of (healed) traumatic fracture: Secondary | ICD-10-CM | POA: Insufficient documentation

## 2012-12-02 DIAGNOSIS — K229 Disease of esophagus, unspecified: Secondary | ICD-10-CM | POA: Insufficient documentation

## 2012-12-02 DIAGNOSIS — Z8739 Personal history of other diseases of the musculoskeletal system and connective tissue: Secondary | ICD-10-CM | POA: Insufficient documentation

## 2012-12-02 DIAGNOSIS — Z87891 Personal history of nicotine dependence: Secondary | ICD-10-CM | POA: Insufficient documentation

## 2012-12-02 DIAGNOSIS — R011 Cardiac murmur, unspecified: Secondary | ICD-10-CM | POA: Insufficient documentation

## 2012-12-02 NOTE — ED Notes (Signed)
Pt reporting having something stuck in her throat and unable to clear. Pt reports she was eating a hamburger at the time.  At present, do difficulty breathing or speaking.

## 2012-12-03 ENCOUNTER — Emergency Department (HOSPITAL_COMMUNITY): Payer: 59

## 2012-12-03 MED ORDER — GLUCAGON HCL (RDNA) 1 MG IJ SOLR
1.0000 mg | Freq: Once | INTRAMUSCULAR | Status: AC
  Administered 2012-12-03: 1 mg via INTRAVENOUS
  Filled 2012-12-03: qty 1

## 2012-12-03 MED ORDER — ONDANSETRON HCL 8 MG PO TABS: 8.0000 mg | ORAL_TABLET | ORAL | Status: DC | PRN

## 2012-12-03 MED ORDER — METHYLPREDNISOLONE SODIUM SUCC 125 MG IJ SOLR
125.0000 mg | Freq: Once | INTRAMUSCULAR | Status: AC
  Administered 2012-12-03: 125 mg via INTRAVENOUS
  Filled 2012-12-03: qty 2

## 2012-12-03 MED ORDER — KETOROLAC TROMETHAMINE 30 MG/ML IJ SOLN
30.0000 mg | Freq: Once | INTRAMUSCULAR | Status: AC
  Administered 2012-12-03: 30 mg via INTRAVENOUS
  Filled 2012-12-03: qty 1

## 2012-12-03 MED ORDER — SODIUM CHLORIDE 0.9 % IV BOLUS (SEPSIS)
1000.0000 mL | Freq: Once | INTRAVENOUS | Status: AC
  Administered 2012-12-03: 1000 mL via INTRAVENOUS

## 2012-12-03 NOTE — ED Notes (Signed)
Pt states pain is the same when swallowing or coughing

## 2012-12-03 NOTE — ED Provider Notes (Signed)
CSN: [id number]     Arrival date & time 12/02/12  2346 History   First MD Initiated Contact with Patient 12/03/12 0005     Chief Complaint  Patient presents with  . Airway Obstruction   (Consider location/radiation/quality/duration/timing/severity/associated sxs/prior Treatment) HPI..... patient was eating a hamburger when she felt a sharp irritation in her throat. She is able to swallow. No airway distress. Nothing makes symptoms better or worse. Severity is mild to moderate. No radiation of discomfort.  Past Medical History  Diagnosis Date  . Herniated disc     L4, L5  . Urinary tract infection     history  . Fractured     rt elbow, rt wrist; rt and left ankle, nose ( h/o )  . Heart murmur     as infant - no problems as adult  . Pregnancy induced hypertension     no meds  . GERD (gastroesophageal reflux disease)     takes tums as needed  . Headache(784.0)     tx with otc meds prn   Past Surgical History  Procedure Laterality Date  . Right hand surgery      for laceration  . Tubes in ears      as child  . Bladder stretch      as child  . Spinal tap      as child  . Cesarean section  05/08/2011    Procedure: CESAREAN SECTION;  Surgeon: Zelphia Cairo, MD;  Location: [facility];  Service: Gynecology;  Laterality: N/A;  PRIMARY  this case was scheduled with the permission of Dr Malen Gauze due to patient BMI and the need for the surgeon to have an assistant [facility] 05/13/11   Family History  Problem Relation Age of Onset  . Anesthesia problems Neg Hx   . Hypotension Neg Hx   . Malignant hyperthermia Neg Hx   . Pseudochol deficiency Neg Hx   . Hypertension Mother    History  Substance Use Topics  . Smoking status: Former Smoker -- 0.25 packs/day for 3 years    Types: Cigarettes  . Smokeless tobacco: Never Used  . Alcohol Use: Yes     Comment: occasional   OB History   Grav Para Term Preterm Abortions TAB SAB Ect Mult Living   1 1 1  0 0 0 0 0 0 1     Review of Systems   All other systems reviewed and are negative.    Allergies  Review of patient's allergies indicates no known allergies.  Home Medications   Current Outpatient Rx  Name  Route  Sig  Dispense  Refill  . ibuprofen (ADVIL,MOTRIN) 200 MG tablet   Oral   Take 800 mg by mouth every 6 (six) hours as needed for pain.          BP 111/82  Pulse 74  Temp(Src) 98.5 F (36.9 C) (Oral)  Resp 20  SpO2 100%  LMP 11/06/2012 Physical Exam  Nursing note and vitals reviewed. Constitutional: She is oriented to person, place, and time. She appears well-developed and well-nourished.  HENT:  Head: Normocephalic and atraumatic.  No obvious oropharyngeal foreign body  Eyes: Conjunctivae and EOM are normal. Pupils are equal, round, and reactive to light.  Neck: Normal range of motion. Neck supple.  Cardiovascular: Normal rate, regular rhythm and normal heart sounds.   Pulmonary/Chest: Effort normal and breath sounds normal.  Abdominal: Soft. Bowel sounds are normal.  Musculoskeletal: Normal range of motion.  Neurological: She is alert and  oriented to person, place, and time.  Skin: Skin is warm and dry.  Psychiatric: She has a normal mood and affect.    ED Course  Procedures (including critical care time) Labs Review Labs Reviewed - No data to display Imaging Review Dg Neck Soft Tissue  12/03/2012   CLINICAL DATA:  Airway obstruction  EXAM: NECK SOFT TISSUES - 1+ VIEW  COMPARISON:  None.  FINDINGS: There is no evidence of retropharyngeal soft tissue swelling or epiglottic enlargement. The cervical airway is unremarkable and no radio-opaque foreign body identified.  IMPRESSION: Negative.   Electronically Signed   By: Tiburcio Pea M.D.   On: 12/03/2012 00:45    EKG Interpretation   None       MDM   1. Esophagus disorder    IV glucagon, Toradol, Solu Medrol.   Patient is able to swallow.    Soft tissue neck is negative.    Followup with [facility]NT    Donnetta Hutching, MD 12/03/12  0300

## 2012-12-03 NOTE — ED Notes (Signed)
Patient given discharge instruction, verbalized understand. IV removed, band aid applied. Patient ambulatory out of the department.  

## 2012-12-03 NOTE — ED Notes (Signed)
Sister at the bedside. Pt can not tell a difference since medication was given.

## 2012-12-03 NOTE — ED Notes (Signed)
Pt states around 10pm she was eating hamburger and feel like some it stuck in throat. Pt can drink water but it will come back up.

## 2013-04-11 ENCOUNTER — Emergency Department (HOSPITAL_COMMUNITY): Payer: PRIVATE HEALTH INSURANCE

## 2013-04-11 ENCOUNTER — Encounter (HOSPITAL_COMMUNITY): Payer: Self-pay | Admitting: Emergency Medicine

## 2013-04-11 ENCOUNTER — Emergency Department (HOSPITAL_COMMUNITY)
Admission: EM | Admit: 2013-04-11 | Discharge: 2013-04-12 | Disposition: A | Discharge status: Home / Self Care | Payer: PRIVATE HEALTH INSURANCE | Attending: Emergency Medicine | Admitting: Emergency Medicine

## 2013-04-11 DIAGNOSIS — S99919A Unspecified injury of unspecified ankle, initial encounter: Principal | ICD-10-CM

## 2013-04-11 DIAGNOSIS — Z87440 Personal history of urinary (tract) infections: Secondary | ICD-10-CM | POA: Insufficient documentation

## 2013-04-11 DIAGNOSIS — Z8739 Personal history of other diseases of the musculoskeletal system and connective tissue: Secondary | ICD-10-CM | POA: Insufficient documentation

## 2013-04-11 DIAGNOSIS — X58XXXA Exposure to other specified factors, initial encounter: Secondary | ICD-10-CM | POA: Insufficient documentation

## 2013-04-11 DIAGNOSIS — Y9339 Activity, other involving climbing, rappelling and jumping off: Secondary | ICD-10-CM | POA: Insufficient documentation

## 2013-04-11 DIAGNOSIS — K219 Gastro-esophageal reflux disease without esophagitis: Secondary | ICD-10-CM | POA: Insufficient documentation

## 2013-04-11 DIAGNOSIS — Z87891 Personal history of nicotine dependence: Secondary | ICD-10-CM | POA: Insufficient documentation

## 2013-04-11 DIAGNOSIS — S99929A Unspecified injury of unspecified foot, initial encounter: Principal | ICD-10-CM

## 2013-04-11 DIAGNOSIS — S8990XA Unspecified injury of unspecified lower leg, initial encounter: Secondary | ICD-10-CM | POA: Insufficient documentation

## 2013-04-11 DIAGNOSIS — R011 Cardiac murmur, unspecified: Secondary | ICD-10-CM | POA: Insufficient documentation

## 2013-04-11 DIAGNOSIS — M25562 Pain in left knee: Secondary | ICD-10-CM

## 2013-04-11 DIAGNOSIS — Z8781 Personal history of (healed) traumatic fracture: Secondary | ICD-10-CM | POA: Insufficient documentation

## 2013-04-11 DIAGNOSIS — Y929 Unspecified place or not applicable: Secondary | ICD-10-CM | POA: Insufficient documentation

## 2013-04-11 NOTE — ED Provider Notes (Signed)
CSN: [id number]     Arrival date & time 04/11/13  2223 History  This chart was scribed for Lyanne Co, MD by Bennett Scrape, ED Scribe. This patient was seen in room APA01/APA01 and the patient's care was started at 11:39 PM.   Chief Complaint  Patient presents with  . Knee Pain      The history is provided by the patient. No language interpreter was used.    HPI Comments: Nancy Hayden is a 28 y.o. female who presents to the Emergency Department complaining of gradual onset, gradually worsening left knee pain described as tightness that started after jumping up suddenly earlier today. Pt states that she jumped up quickly out of a chair to respond to a fire alarm. She denies feeling a sudden pain or a pop. She has applied ice to the ice with improvement. She has a h/o prior sports injury described as a "jumper's knee" and a prior left ankle fx but denies any prior surgery. She denies any other injuries.   Ortho is Dr. Antony Odea.  Past Medical History  Diagnosis Date  . Herniated disc     L4, L5  . Urinary tract infection     history  . Fractured     rt elbow, rt wrist; rt and left ankle, nose ( h/o )  . Heart murmur     as infant - no problems as adult  . Pregnancy induced hypertension     no meds  . GERD (gastroesophageal reflux disease)     takes tums as needed  . Headache(784.0)     tx with otc meds prn   Past Surgical History  Procedure Laterality Date  . Right hand surgery      for laceration  . Tubes in ears      as child  . Bladder stretch      as child  . Spinal tap      as child  . Cesarean section  05/08/2011    Procedure: CESAREAN SECTION;  Surgeon: Zelphia Cairo, MD;  Location: [facility];  Service: Gynecology;  Laterality: N/A;  PRIMARY  this case was scheduled with the permission of Dr Malen Gauze due to patient BMI and the need for the surgeon to have an assistant [facility] 05/13/11   Family History  Problem Relation Age of Onset  . Anesthesia problems Neg Hx   .  Hypotension Neg Hx   . Malignant hyperthermia Neg Hx   . Pseudochol deficiency Neg Hx   . Hypertension Mother    History  Substance Use Topics  . Smoking status: Former Smoker -- 0.25 packs/day for 3 years    Types: Cigarettes  . Smokeless tobacco: Never Used  . Alcohol Use: No     Comment: occasional   OB History   Grav Para Term Preterm Abortions TAB SAB Ect Mult Living   1 1 1  0 0 0 0 0 0 1     Review of Systems  A complete 10 system review of systems was obtained and all systems are negative except as noted in the HPI and PMH.    Allergies  Review of patient's allergies indicates no known allergies.  Home Medications   Current Outpatient Rx  Name  Route  Sig  Dispense  Refill  . ibuprofen (ADVIL,MOTRIN) 200 MG tablet   Oral   Take 800 mg by mouth every 6 (six) hours as needed for pain.          Triage Vitals:  BP 139/94  Pulse 89  Temp(Src) 97.7 F (36.5 C) (Oral)  Resp 20  Ht 5\' 9"  (1.753 m)  Wt 350 lb (158.759 kg)  BMI 51.66 kg/m2  SpO2 99%  LMP 03/13/2013  Physical Exam  Nursing note and vitals reviewed. Constitutional: She is oriented to person, place, and time. She appears well-developed and well-nourished. No distress.  HENT:  Head: Normocephalic and atraumatic.  Eyes: EOM are normal.  Neck: Normal range of motion.  Cardiovascular: Normal rate.   Pulmonary/Chest: Effort normal. No respiratory distress.  Abdominal: She exhibits no distension.  Musculoskeletal: Normal range of motion.  Mild lateral joint tenderness to the left knee. No obvious deformity. No warmth or erythema. Normal strength in the left knee. Normal extensor function.   Neurological: She is alert and oriented to person, place, and time.  Skin: Skin is warm and dry.  Psychiatric: She has a normal mood and affect. Judgment normal.    ED Course  Procedures (including critical care time)  DIAGNOSTIC STUDIES: Oxygen Saturation is 99% on RA, normal by my interpretation.     COORDINATION OF CARE: 11:42 PM-Discussed treatment plan which includes left knee x-rays with pt at bedside and pt agreed to plan. Pt declined offered medications. Recommended f/u with Ortho. Discussed antiinflammatories over the next 2 to 3 days and pain medication PRN.   12:41 AM-Informed pt of negative radiology results. Discussed discharge plan as above. Pt is agreeable.   Labs Review Labs Reviewed - No data to display Imaging Review Dg Knee Complete 4 Views Left  04/12/2013   CLINICAL DATA Left knee pain after fall.  EXAM LEFT KNEE - COMPLETE 4+ VIEW  COMPARISON None.  FINDINGS There is no evidence of fracture, dislocation, or joint effusion. There is no evidence of arthropathy or other focal bone abnormality. Soft tissues are unremarkable.  IMPRESSION Normal left knee.  SIGNATURE  Electronically Signed   By: Roque LiasJames  Green M.D.   On: 04/12/2013 00:40  I personally reviewed the imaging tests through PACS system I reviewed available ER/hospitalization records through the EMR    EKG Interpretation None      MDM   Final diagnoses:  Left knee pain    I personally performed the services described in this documentation, which was scribed in my presence. The recorded information has been reviewed and is accurate.       Lyanne CoKevin M Demaree Liberto, MD 04/12/13 [phone]

## 2013-04-11 NOTE — ED Notes (Signed)
Placed ice pack on left knee.

## 2013-04-11 NOTE — ED Notes (Signed)
Pt c/o left knee pain and right ankle pain.

## 2013-04-12 MED ORDER — IBUPROFEN 600 MG PO TABS: 600.0000 mg | ORAL_TABLET | Freq: Three times a day (TID) | ORAL | Status: DC | PRN

## 2013-04-12 MED ORDER — HYDROCODONE-ACETAMINOPHEN 5-325 MG PO TABS: 1.0000 | ORAL_TABLET | ORAL | Status: DC | PRN

## 2013-04-12 NOTE — Discharge Instructions (Signed)
Knee Pain Knee pain can be a result of an injury or other medical conditions. Treatment will depend on the cause of your pain. HOME CARE  Only take medicine as told by your doctor.  Keep a healthy weight. Being overweight can make the knee hurt more.  Stretch before exercising or playing sports.  If there is constant knee pain, change the way you exercise. Ask your doctor for advice.  Make sure shoes fit well. Choose the right shoe for the sport or activity.  Protect your knees. Wear kneepads if needed.  Rest when you are tired. GET HELP RIGHT AWAY IF:   Your knee pain does not stop.  Your knee pain does not get better.  Your knee joint feels hot to the touch.  You have a fever. MAKE SURE YOU:   Understand these instructions.  Will watch this condition.  Will get help right away if you are not doing well or get worse. Document Released: 04/17/2008 Document Revised: 04/13/2011 Document Reviewed: 04/17/2008 [employer] Patient Information 2014 [employer].  

## 2013-05-11 LAB — OB RESULTS CONSOLE RPR: RPR: NONREACTIVE

## 2013-05-11 LAB — OB RESULTS CONSOLE RUBELLA ANTIBODY, IGM: RUBELLA: IMMUNE

## 2013-05-11 LAB — OB RESULTS CONSOLE HIV ANTIBODY (ROUTINE TESTING): HIV: NONREACTIVE

## 2013-05-11 LAB — OB RESULTS CONSOLE HEPATITIS B SURFACE ANTIGEN: Hepatitis B Surface Ag: NEGATIVE

## 2013-09-23 ENCOUNTER — Encounter (HOSPITAL_COMMUNITY): Payer: Self-pay

## 2013-09-23 ENCOUNTER — Inpatient Hospital Stay (HOSPITAL_COMMUNITY)
Admission: AD | Admit: 2013-09-23 | Discharge: 2013-09-23 | Disposition: A | Discharge status: Home / Self Care | Payer: 59 | Source: Ambulatory Visit | Attending: Obstetrics and Gynecology | Admitting: Obstetrics and Gynecology

## 2013-09-23 DIAGNOSIS — R030 Elevated blood-pressure reading, without diagnosis of hypertension: Secondary | ICD-10-CM | POA: Insufficient documentation

## 2013-09-23 DIAGNOSIS — N390 Urinary tract infection, site not specified: Secondary | ICD-10-CM | POA: Insufficient documentation

## 2013-09-23 DIAGNOSIS — O99891 Other specified diseases and conditions complicating pregnancy: Secondary | ICD-10-CM | POA: Insufficient documentation

## 2013-09-23 DIAGNOSIS — R109 Unspecified abdominal pain: Secondary | ICD-10-CM | POA: Insufficient documentation

## 2013-09-23 DIAGNOSIS — M545 Low back pain, unspecified: Secondary | ICD-10-CM | POA: Insufficient documentation

## 2013-09-23 DIAGNOSIS — O2390 Unspecified genitourinary tract infection in pregnancy, unspecified trimester: Secondary | ICD-10-CM | POA: Insufficient documentation

## 2013-09-23 DIAGNOSIS — IMO0001 Reserved for inherently not codable concepts without codable children: Secondary | ICD-10-CM

## 2013-09-23 DIAGNOSIS — O9989 Other specified diseases and conditions complicating pregnancy, childbirth and the puerperium: Secondary | ICD-10-CM

## 2013-09-23 DIAGNOSIS — O368190 Decreased fetal movements, unspecified trimester, not applicable or unspecified: Secondary | ICD-10-CM | POA: Diagnosis present

## 2013-09-23 DIAGNOSIS — O368121 Decreased fetal movements, second trimester, fetus 1: Secondary | ICD-10-CM

## 2013-09-23 DIAGNOSIS — K219 Gastro-esophageal reflux disease without esophagitis: Secondary | ICD-10-CM | POA: Diagnosis not present

## 2013-09-23 DIAGNOSIS — O2342 Unspecified infection of urinary tract in pregnancy, second trimester: Secondary | ICD-10-CM

## 2013-09-23 DIAGNOSIS — Z87891 Personal history of nicotine dependence: Secondary | ICD-10-CM | POA: Diagnosis not present

## 2013-09-23 LAB — URINALYSIS, ROUTINE W REFLEX MICROSCOPIC
BILIRUBIN URINE: NEGATIVE
GLUCOSE, UA: NEGATIVE mg/dL
HGB URINE DIPSTICK: NEGATIVE
KETONES UR: NEGATIVE mg/dL
Nitrite: NEGATIVE
PROTEIN: NEGATIVE mg/dL
Specific Gravity, Urine: 1.015 (ref 1.005–1.030)
Urobilinogen, UA: 1 mg/dL (ref 0.0–1.0)
pH: 7 (ref 5.0–8.0)

## 2013-09-23 LAB — CBC
HCT: 33.1 % — ABNORMAL LOW (ref 36.0–46.0)
Hemoglobin: 11.2 g/dL — ABNORMAL LOW (ref 12.0–15.0)
MCH: 28.1 pg (ref 26.0–34.0)
MCHC: 33.8 g/dL (ref 30.0–36.0)
MCV: 83.2 fL (ref 78.0–100.0)
Platelets: 294 10*3/uL (ref 150–400)
RBC: 3.98 MIL/uL (ref 3.87–5.11)
RDW: 15.5 % (ref 11.5–15.5)
WBC: 10.2 10*3/uL (ref 4.0–10.5)

## 2013-09-23 LAB — COMPREHENSIVE METABOLIC PANEL
ALT: 13 U/L (ref 0–35)
AST: 10 U/L (ref 0–37)
Albumin: 2.5 g/dL — ABNORMAL LOW (ref 3.5–5.2)
Alkaline Phosphatase: 65 U/L (ref 39–117)
Anion gap: 11 (ref 5–15)
BUN: 6 mg/dL (ref 6–23)
CO2: 22 mEq/L (ref 19–32)
Calcium: 8.3 mg/dL — ABNORMAL LOW (ref 8.4–10.5)
Chloride: 106 mEq/L (ref 96–112)
Creatinine, Ser: 0.62 mg/dL (ref 0.50–1.10)
GFR calc Af Amer: >90 mL/min (ref >90)
GFR calc non Af Amer: >90 mL/min (ref >90)
Glucose, Bld: 105 mg/dL — ABNORMAL HIGH (ref 70–99)
Potassium: 4 mEq/L (ref 3.7–5.3)
Sodium: 139 mEq/L (ref 137–147)
Total Bilirubin: 0.3 mg/dL (ref 0.3–1.2)
Total Protein: 5.9 g/dL — ABNORMAL LOW (ref 6.0–8.3)

## 2013-09-23 LAB — PROTEIN / CREATININE RATIO, URINE
Creatinine, Urine: 218.43 mg/dL
Protein Creatinine Ratio: 0.09 (ref 0.00–0.15)
Total Protein, Urine: 18.8 mg/dL

## 2013-09-23 LAB — LACTATE DEHYDROGENASE: LDH: 148 U/L (ref 94–250)

## 2013-09-23 LAB — URINE MICROSCOPIC-ADD ON

## 2013-09-23 LAB — URIC ACID: Uric Acid, Serum: 3.1 mg/dL (ref 2.4–7.0)

## 2013-09-23 MED ORDER — NITROFURANTOIN MONOHYD MACRO 100 MG PO CAPS
100.0000 mg | ORAL_CAPSULE | Freq: Two times a day (BID) | ORAL | Status: DC
Start: 2013-09-23 — End: 2013-11-16

## 2013-09-23 NOTE — MAU Provider Note (Signed)
History     CSN: [id number]  Arrival date and time: 09/23/13 1247   First Provider Initiated Contact with Patient 09/23/13 1337      Chief Complaint  Patient presents with  . Abdominal Pain  . Decreased Fetal Movement   HPI Nancy Hayden is a 28 y.o. G2P1001 at 27w6. She woke up at 5:30 this am with low abd pain and low back pain. It is off/on, not cramping. No bleeding or spotting, no leaking. She has had decreased FM x 2 days. Her B/P is elevated- hx elevated B/P with last pregnancy. She denies headache, visual change or epigastric pain.      Past Medical History  Diagnosis Date  . Herniated disc     L4, L5  . Urinary tract infection     history  . Fractured     rt elbow, rt wrist; rt and left ankle, nose ( h/o )  . Heart murmur     as infant - no problems as adult  . Pregnancy induced hypertension     no meds  . GERD (gastroesophageal reflux disease)     takes tums as needed  . Headache(784.0)     tx with otc meds prn    Past Surgical History  Procedure Laterality Date  . Right hand surgery      for laceration  . Tubes in ears      as child  . Bladder stretch      as child  . Spinal tap      as child  . Cesarean section  05/08/2011    Procedure: CESAREAN SECTION;  Surgeon: Zelphia Cairo, MD;  Location: [facility];  Service: Gynecology;  Laterality: N/A;  PRIMARY  this case was scheduled with the permission of Dr Malen Gauze due to patient BMI and the need for the surgeon to have an assistant [facility] 05/13/11    Family History  Problem Relation Age of Onset  . Anesthesia problems Neg Hx   . Hypotension Neg Hx   . Malignant hyperthermia Neg Hx   . Pseudochol deficiency Neg Hx   . Hypertension Mother     History  Substance Use Topics  . Smoking status: Former Smoker -- 0.25 packs/day for 3 years    Types: Cigarettes  . Smokeless tobacco: Never Used  . Alcohol Use: No     Comment: occasional    Allergies: No Known Allergies  Prescriptions prior to  admission  Medication Sig Dispense Refill  . HYDROcodone-acetaminophen (NORCO/VICODIN) 5-325 MG per tablet Take 1 tablet by mouth every 4 (four) hours as needed for moderate pain.  15 tablet  0  . ibuprofen (ADVIL,MOTRIN) 200 MG tablet Take 800 mg by mouth every 6 (six) hours as needed for pain.      Marland Kitchen ibuprofen (ADVIL,MOTRIN) 600 MG tablet Take 1 tablet (600 mg total) by mouth every 8 (eight) hours as needed.  15 tablet  0    Review of Systems  Constitutional: Negative for fever and chills.  Gastrointestinal: Positive for abdominal pain. Negative for heartburn.  Musculoskeletal: Positive for back pain.  Neurological: Negative for sensory change and seizures.   Physical Exam   Blood pressure 147/61, pulse 127, temperature 97.7 F (36.5 C), temperature source Oral, resp. rate 18, last menstrual period 03/12/2013.  Physical Exam  Constitutional: She is oriented to person, place, and time. She appears well-developed and well-nourished.  GI: Soft. She exhibits no distension. There is no tenderness. There is no rebound and no  guarding.  Obese with large pannus  Genitourinary:  Ext gen- nl anatomy, skin intact Vagina- scant white mucoid discharge Cx- long, closed Uterus- gravid Adn- non tender  Musculoskeletal: Normal range of motion.  Neurological: She is alert and oriented to person, place, and time.  Skin: Skin is warm and dry.  Psychiatric: She has a normal mood and affect. Her behavior is normal.    MAU Course  Procedures  MDM Results for orders placed during the hospital encounter of 09/23/13 (from the past 24 hour(s))  URINALYSIS, ROUTINE W REFLEX MICROSCOPIC     Status: Abnormal   Collection Time    09/23/13  1:05 PM      Result Value Ref Range   Color, Urine YELLOW  YELLOW   APPearance CLOUDY (*) CLEAR   Specific Gravity, Urine 1.015  1.005 - 1.030   pH 7.0  5.0 - 8.0   Glucose, UA NEGATIVE  NEGATIVE mg/dL   Hgb urine dipstick NEGATIVE  NEGATIVE   Bilirubin Urine  NEGATIVE  NEGATIVE   Ketones, ur NEGATIVE  NEGATIVE mg/dL   Protein, ur NEGATIVE  NEGATIVE mg/dL   Urobilinogen, UA 1.0  0.0 - 1.0 mg/dL   Nitrite NEGATIVE  NEGATIVE   Leukocytes, UA MODERATE (*) NEGATIVE  PROTEIN / CREATININE RATIO, URINE     Status: None   Collection Time    09/23/13  1:05 PM      Result Value Ref Range   Creatinine, Urine 218.43     Total Protein, Urine 18.8     PROTEIN CREATININE RATIO 0.09  0.00 - 0.15  URINE MICROSCOPIC-ADD ON     Status: Abnormal   Collection Time    09/23/13  1:05 PM      Result Value Ref Range   Squamous Epithelial / LPF FEW (*) RARE   WBC, UA 11-20  <3 WBC/hpf   RBC / HPF 0-2  <3 RBC/hpf   Bacteria, UA MANY (*) RARE  CBC     Status: Abnormal   Collection Time    09/23/13  1:47 PM      Result Value Ref Range   WBC 10.2  4.0 - 10.5 K/uL   RBC 3.98  3.87 - 5.11 MIL/uL   Hemoglobin 11.2 (*) 12.0 - 15.0 g/dL   HCT 46.933.1 (*) 62.936.0 - 52.846.0 %   MCV 83.2  78.0 - 100.0 fL   MCH 28.1  26.0 - 34.0 pg   MCHC 33.8  30.0 - 36.0 g/dL   RDW 41.315.5  24.411.5 - 01.015.5 %   Platelets 294  150 - 400 K/uL  COMPREHENSIVE METABOLIC PANEL     Status: Abnormal   Collection Time    09/23/13  1:47 PM      Result Value Ref Range   Sodium 139  137 - 147 mEq/L   Potassium 4.0  3.7 - 5.3 mEq/L   Chloride 106  96 - 112 mEq/L   CO2 22  19 - 32 mEq/L   Glucose, Bld 105 (*) 70 - 99 mg/dL   BUN 6  6 - 23 mg/dL   Creatinine, Ser 2.720.62  0.50 - 1.10 mg/dL   Calcium 8.3 (*) 8.4 - 10.5 mg/dL   Total Protein 5.9 (*) 6.0 - 8.3 g/dL   Albumin 2.5 (*) 3.5 - 5.2 g/dL   AST 10  0 - 37 U/L   ALT 13  0 - 35 U/L   Alkaline Phosphatase 65  39 - 117 U/L   Total Bilirubin  0.3  0.3 - 1.2 mg/dL   GFR calc non Af Amer >90  >90 mL/min   GFR calc Af Amer >90  >90 mL/min   Anion gap 11  5 - 15  URIC ACID     Status: None   Collection Time    09/23/13  1:47 PM      Result Value Ref Range   Uric Acid, Serum 3.1  2.4 - 7.0 mg/dL  LACTATE DEHYDROGENASE     Status: None   Collection Time     09/23/13  1:47 PM      Result Value Ref Range   LDH 148  94 - 250 U/L    B/P 140-179/61-68 Pulse upper 90's Reassuring strip  Assessment and Plan  27 6/[redacted] wks EGA B/P elevated, PIH labs neg UTI- C&S pending, tx with Macrobid Decreased fetal movement with good movement now, reassuring strip Appt 9/3 in office with Dr Marcelle Overlie for prenatal care Consulted with Dr Kennis Carina, [place]. 09/23/2013, 1:41 PM

## 2013-09-23 NOTE — MAU Note (Signed)
Pt presents complaining of abdominal and pelvic pain when she moves and decreased fetal movement. States she has felt movement today but it's been a signficant decrease over the last two days. Denies vaginal bleeding and discharge.

## 2013-09-25 LAB — CULTURE, OB URINE: Special Requests: NORMAL

## 2013-11-16 ENCOUNTER — Encounter (HOSPITAL_COMMUNITY): Payer: Self-pay

## 2013-11-23 ENCOUNTER — Encounter (HOSPITAL_COMMUNITY): Payer: Self-pay

## 2013-11-24 ENCOUNTER — Inpatient Hospital Stay (HOSPITAL_COMMUNITY)
Admission: AD | Admit: 2013-11-24 | Discharge: 2013-11-24 | Disposition: A | Discharge status: Home / Self Care | Payer: 59 | Source: Ambulatory Visit | Attending: Obstetrics & Gynecology | Admitting: Obstetrics & Gynecology

## 2013-11-24 ENCOUNTER — Encounter (HOSPITAL_COMMUNITY): Payer: Self-pay | Admitting: *Deleted

## 2013-11-24 DIAGNOSIS — Z3A36 36 weeks gestation of pregnancy: Secondary | ICD-10-CM | POA: Insufficient documentation

## 2013-11-24 DIAGNOSIS — O133 Gestational [pregnancy-induced] hypertension without significant proteinuria, third trimester: Secondary | ICD-10-CM | POA: Diagnosis not present

## 2013-11-24 LAB — PROTEIN / CREATININE RATIO, URINE
Creatinine, Urine: 121.42 mg/dL
Protein Creatinine Ratio: 0.08 (ref 0.00–0.15)
Total Protein, Urine: 9.2 mg/dL

## 2013-11-24 LAB — COMPREHENSIVE METABOLIC PANEL
ALT: 12 U/L (ref 0–35)
AST: 9 U/L (ref 0–37)
Albumin: 2.3 g/dL — ABNORMAL LOW (ref 3.5–5.2)
Alkaline Phosphatase: 78 U/L (ref 39–117)
Anion gap: 10 (ref 5–15)
BUN: 9 mg/dL (ref 6–23)
CO2: 22 meq/L (ref 19–32)
Calcium: 8.8 mg/dL (ref 8.4–10.5)
Chloride: 104 mEq/L (ref 96–112)
Creatinine, Ser: 0.63 mg/dL (ref 0.50–1.10)
GFR calc Af Amer: >90 mL/min (ref >90)
GFR calc non Af Amer: >90 mL/min (ref >90)
Glucose, Bld: 86 mg/dL (ref 70–99)
Potassium: 3.8 mEq/L (ref 3.7–5.3)
Sodium: 136 mEq/L — ABNORMAL LOW (ref 137–147)
TOTAL PROTEIN: 6.4 g/dL (ref 6.0–8.3)
Total Bilirubin: 0.2 mg/dL — ABNORMAL LOW (ref 0.3–1.2)

## 2013-11-24 LAB — CBC
HCT: 32.8 % — ABNORMAL LOW (ref 36.0–46.0)
HEMOGLOBIN: 10.4 g/dL — AB (ref 12.0–15.0)
MCH: 26.5 pg (ref 26.0–34.0)
MCHC: 31.7 g/dL (ref 30.0–36.0)
MCV: 83.7 fL (ref 78.0–100.0)
Platelets: 277 10*3/uL (ref 150–400)
RBC: 3.92 MIL/uL (ref 3.87–5.11)
RDW: 16.5 % — ABNORMAL HIGH (ref 11.5–15.5)
WBC: 9.1 10*3/uL (ref 4.0–10.5)

## 2013-11-24 NOTE — MAU Provider Note (Signed)
Chief Complaint:  Hypertension   First Provider Initiated Contact with Patient 11/24/13 1307      HPI: Nancy Hayden is a 28 y.o. G2P1001 at [redacted]w[redacted]d who was sent from office visit today for further evaluationdue to blood pressure elevation with systolic about 150. She's been followed for the last few weeks for gestational hypertension. She denies headache, visual disturbances, epigastric pain, N/V. Denies contractions, leakage of fluid or vaginal bleeding. Good fetal movement.   Pregnancy Course: Morbid obesity, GHTN, GCT 140, 3hr WNL; IOL scheduled for 10/27  Past Medical History: Past Medical History  Diagnosis Date  . Herniated disc     L4, L5  . Urinary tract infection     history  . Fractured     rt elbow, rt wrist; rt and left ankle, nose ( h/o )  . Heart murmur     as infant - no problems as adult  . Pregnancy induced hypertension     no meds  . GERD (gastroesophageal reflux disease)     takes tums as needed  . Headache(784.0)     tx with otc meds prn    Past obstetric history: OB History  Gravida Para Term Preterm AB SAB TAB Ectopic Multiple Living  2 1 1  0 0 0 0 0 0 1    # Outcome Date GA Lbr Len/2nd Weight Sex Delivery Anes PTL Lv  2 CUR           1 TRM 05/08/11 [redacted]w[redacted]d  4.1 kg (9 lb 0.6 oz) F LTCS Spinal  Y     Comments: no dysmorphic features      Past Surgical History: Past Surgical History  Procedure Laterality Date  . Right hand surgery      for laceration  . Tubes in ears      as child  . Bladder stretch      as child  . Spinal tap      as child  . Cesarean section  05/08/2011    Procedure: CESAREAN SECTION;  Surgeon: Zelphia Cairo, MD;  Location: [facility];  Service: Gynecology;  Laterality: N/A;  PRIMARY  this case was scheduled with the permission of Dr Malen Gauze due to patient BMI and the need for the surgeon to have an assistant [facility] 05/13/11     Family History: Family History  Problem Relation Age of Onset  . Anesthesia problems Neg Hx   .  Hypotension Neg Hx   . Malignant hyperthermia Neg Hx   . Pseudochol deficiency Neg Hx   . Hypertension Mother     Social History: History  Substance Use Topics  . Smoking status: Never Smoker   . Smokeless tobacco: Never Used  . Alcohol Use: No     Comment: occasional    Allergies: No Known Allergies  Meds:  Prescriptions prior to admission  Medication Sig Dispense Refill  . acetaminophen (TYLENOL) 500 MG tablet Take 1,000 mg by mouth daily as needed for mild pain or headache.       . Prenatal Vit-Fe Fumarate-FA (PRENATAL MULTIVITAMIN) TABS tablet Take 1 tablet by mouth daily.      . ranitidine (ZANTAC) 150 MG tablet Take 150 mg by mouth 2 (two) times daily.        ROS: Pertinent findings in history of present illness.  Physical Exam  Blood pressure 139/71, pulse 84, temperature 98.3 F (36.8 C), temperature source Oral, resp. rate 20, weight 190.964 kg (421 lb), last menstrual period 03/12/2013. Filed Vitals:  11/24/13 1333 11/24/13 1348 11/24/13 1403 11/24/13 1418  BP: 139/71 143/80 163/78 140/74  Pulse: 84 88 92 90  Temp:      TempSrc:      Resp:      Weight:       GENERAL: Morbidly obese female in no acute distress.  HEENT: normocephalic HEART: normal rate RESP: normal effort ABDOMEN: Soft, non-tender, gravid appropriate for gestational age EXTREMITIES: Nontender, no edema NEURO: alert and oriented, DTRs 1+     FHT:  Baseline 125-130 , moderate variability, accelerations present, no decelerations Contractions: UI   Labs: Results for orders placed during the hospital encounter of 11/24/13 (from the past 24 hour(s))  PROTEIN / CREATININE RATIO, URINE     Status: None   Collection Time    11/24/13 12:25 PM      Result Value Ref Range   Creatinine, Urine 121.42     Total Protein, Urine 9.2     Protein Creatinine Ratio 0.08  0.00 - 0.15  CBC     Status: Abnormal   Collection Time    11/24/13  1:20 PM      Result Value Ref Range   WBC 9.1  4.0 - 10.5  K/uL   RBC 3.92  3.87 - 5.11 MIL/uL   Hemoglobin 10.4 (*) 12.0 - 15.0 g/dL   HCT 91.432.8 (*) 78.236.0 - 95.646.0 %   MCV 83.7  78.0 - 100.0 fL   MCH 26.5  26.0 - 34.0 pg   MCHC 31.7  30.0 - 36.0 g/dL   RDW 21.316.5 (*) 08.611.5 - 57.815.5 %   Platelets 277  150 - 400 K/uL  COMPREHENSIVE METABOLIC PANEL     Status: Abnormal   Collection Time    11/24/13  1:20 PM      Result Value Ref Range   Sodium 136 (*) 137 - 147 mEq/L   Potassium 3.8  3.7 - 5.3 mEq/L   Chloride 104  96 - 112 mEq/L   CO2 22  19 - 32 mEq/L   Glucose, Bld 86  70 - 99 mg/dL   BUN 9  6 - 23 mg/dL   Creatinine, Ser 4.690.63  0.50 - 1.10 mg/dL   Calcium 8.8  8.4 - 62.910.5 mg/dL   Total Protein 6.4  6.0 - 8.3 g/dL   Albumin 2.3 (*) 3.5 - 5.2 g/dL   AST 9  0 - 37 U/L   ALT 12  0 - 35 U/L   Alkaline Phosphatase 78  39 - 117 U/L   Total Bilirubin 0.2 (*) 0.3 - 1.2 mg/dL   GFR calc non Af Amer >90  >90 mL/min   GFR calc Af Amer >90  >90 mL/min   Anion gap 10  5 - 15    Imaging:  No results found. MAU Course: No evident superimposed preeclampsia found D/W Dr. Langston MaskerMorris  Assessment: 1. Gestational hypertension w/o significant proteinuria in 3rd trimester   28 yo G2P1001 at 7038w5d Cat 1 FHR  Plan: Discharge home Labor precautions and fetal kick counts    Medication List         acetaminophen 500 MG tablet  Commonly known as:  TYLENOL  Take 1,000 mg by mouth daily as needed for mild pain or headache.     prenatal multivitamin Tabs tablet  Take 1 tablet by mouth daily.     ranitidine 150 MG tablet  Commonly known as:  ZANTAC  Take 150 mg by mouth 2 (two) times daily.  Follow-up Information   Follow up with Mitchel HonourMORRIS, MEGAN, DO. (Keep your scheduled prenatal appointment)    Specialty:  Obstetrics and Gynecology   Contact information:   [address], [address], [address] [place] [address] [phone]       Danae OrleansDeirdre C Poe, CNM 11/24/2013 1:50 PM

## 2013-11-24 NOTE — Patient Instructions (Addendum)
   Your procedure is scheduled on:  Tuesday, Oct 27  Enter through the Main Entrance of [facility] at:  11:30 AM Pick up the phone at the desk and dial [phone] and inform us of your arrival.  Please call this number if you have any problems the morning of surgery: [phone]  Remember: Do not eat food after midnight: Monday Do not drink clear liquids after: 9 AM Tuesday, day of surgery Take these medicines the morning of surgery with a SIP OF WATER:  zantac  Do not wear jewelry, make-up, or FINGER nail polish No metal in your hair or on your body. Do not wear lotions, powders, perfumes.  You may wear deodorant.  Do not bring valuables to the hospital. Contacts, dentures or bridgework may not be worn into surgery.  Leave suitcase in the car. After Surgery it may be brought to your room. For patients being admitted to the hospital, checkout time is 11:00am the day of discharge.  Home with Husband B.J.  cell [phone].

## 2013-11-24 NOTE — Discharge Instructions (Signed)

## 2013-11-24 NOTE — MAU Note (Signed)
Sent from office with elevated BP, ongoing problem.  Denies HA (had one earlier today- gone now), visual changes or epigastric pain.  Reports increase in swelling in hands and feet.

## 2013-11-24 NOTE — MAU Note (Signed)
Urine in lab 

## 2013-11-27 ENCOUNTER — Encounter (HOSPITAL_COMMUNITY)
Admission: RE | Admit: 2013-11-27 | Discharge: 2013-11-27 | Disposition: A | Discharge status: Home / Self Care | Payer: 59 | Source: Ambulatory Visit | Attending: Obstetrics and Gynecology | Admitting: Obstetrics and Gynecology

## 2013-11-27 ENCOUNTER — Encounter (HOSPITAL_COMMUNITY): Payer: Self-pay

## 2013-11-27 LAB — ABO/RH: ABO/RH(D): A POS

## 2013-11-27 LAB — CBC
HCT: 33.8 % — ABNORMAL LOW (ref 36.0–46.0)
HEMOGLOBIN: 10.8 g/dL — AB (ref 12.0–15.0)
MCH: 26.5 pg (ref 26.0–34.0)
MCHC: 32 g/dL (ref 30.0–36.0)
MCV: 83 fL (ref 78.0–100.0)
Platelets: 284 10*3/uL (ref 150–400)
RBC: 4.07 MIL/uL (ref 3.87–5.11)
RDW: 16.3 % — ABNORMAL HIGH (ref 11.5–15.5)
WBC: 9.3 10*3/uL (ref 4.0–10.5)

## 2013-11-27 LAB — RPR

## 2013-11-27 NOTE — H&P (Addendum)
Nancy Hayden is a 28 y.o. female presenting for repeat c-section.  Pregnancy complicated by worsening GHTN.  BP in office 140-150s/80/90s.  History OB History   Grav Para Term Preterm Abortions TAB SAB Ect Mult Living   2 1 1  0 0 0 0 0 0 1     Past Medical History  Diagnosis Date  . Herniated disc     L4, L5  . Urinary tract infection     history  . Fractured     rt elbow, rt wrist; rt and left ankle, nose ( h/o )  . Heart murmur     as infant - no problems as adult  . Pregnancy induced hypertension     no meds  . GERD (gastroesophageal reflux disease)     takes tums as needed  . Headache(784.0)     tx with otc meds prn   Past Surgical History  Procedure Laterality Date  . Right hand surgery      for laceration  . Tubes in ears      as child  . Bladder stretch      as child  . Spinal tap      as child  . Cesarean section  05/08/2011    Procedure: CESAREAN SECTION;  Surgeon: Zelphia CairoGretchen Teaghan Formica, MD;  Location: [facility];  Service: Gynecology;  Laterality: N/A;  PRIMARY  this case was scheduled with the permission of Dr Malen GauzeFoster due to patient BMI and the need for the surgeon to have an assistant [facility]   Family History: family history includes Hypertension in her mother. There is no history of Anesthesia problems, Hypotension, Malignant hyperthermia, or Pseudochol deficiency. Social History:  reports that she has never smoked. She has never used smokeless tobacco. She reports that she does not drink alcohol or use illicit drugs.   Prenatal Transfer Tool  Maternal Diabetes: No Genetic Screening: Normal Maternal Ultrasounds/Referrals: Normal Fetal Ultrasounds or other Referrals:  None Maternal Substance Abuse:  No Significant Maternal Medications:  None Significant Maternal Lab Results:  None Other Comments:  None  ROS    Last menstrual period 03/12/2013. Exam Physical Exam  Gen - NAD Abd - obese, gravid, NT Ext - NT PV - closed  Prenatal labs: ABO, Rh:    Antibody:   Rubella: Immune (04/09 0000) RPR: Nonreactive (04/09 0000)  HBsAg: Negative (04/09 0000)  HIV: Non-reactive (04/09 0000)  GBS:     Assessment/Plan: Previous c-section, worsening GHTN R/b/a discussed, questions answered, informed consent    Festus Pursel 11/27/2013, 7:38 AM

## 2013-11-28 ENCOUNTER — Encounter (HOSPITAL_COMMUNITY): Payer: 59 | Admitting: Anesthesiology

## 2013-11-28 ENCOUNTER — Inpatient Hospital Stay (HOSPITAL_COMMUNITY)
Admission: RE | Admit: 2013-11-28 | Discharge: 2013-12-01 | DRG: 765 | Disposition: A | Discharge status: Home / Self Care | Payer: 59 | Source: Ambulatory Visit | Attending: Obstetrics and Gynecology | Admitting: Obstetrics and Gynecology

## 2013-11-28 ENCOUNTER — Encounter (HOSPITAL_COMMUNITY): Payer: Self-pay | Admitting: *Deleted

## 2013-11-28 ENCOUNTER — Inpatient Hospital Stay (HOSPITAL_COMMUNITY): Payer: 59 | Admitting: Anesthesiology

## 2013-11-28 ENCOUNTER — Encounter (HOSPITAL_COMMUNITY)
Admission: RE | Disposition: A | Discharge status: Home / Self Care | Payer: Self-pay | Source: Ambulatory Visit | Attending: Obstetrics and Gynecology

## 2013-11-28 DIAGNOSIS — K219 Gastro-esophageal reflux disease without esophagitis: Secondary | ICD-10-CM | POA: Diagnosis present

## 2013-11-28 DIAGNOSIS — Z3A37 37 weeks gestation of pregnancy: Secondary | ICD-10-CM | POA: Diagnosis present

## 2013-11-28 DIAGNOSIS — Z8249 Family history of ischemic heart disease and other diseases of the circulatory system: Secondary | ICD-10-CM

## 2013-11-28 DIAGNOSIS — O133 Gestational [pregnancy-induced] hypertension without significant proteinuria, third trimester: Secondary | ICD-10-CM | POA: Diagnosis present

## 2013-11-28 DIAGNOSIS — Z98891 History of uterine scar from previous surgery: Secondary | ICD-10-CM

## 2013-11-28 DIAGNOSIS — Z6841 Body Mass Index (BMI) 40.0 and over, adult: Secondary | ICD-10-CM | POA: Diagnosis not present

## 2013-11-28 DIAGNOSIS — O99214 Obesity complicating childbirth: Secondary | ICD-10-CM | POA: Diagnosis present

## 2013-11-28 DIAGNOSIS — O99613 Diseases of the digestive system complicating pregnancy, third trimester: Secondary | ICD-10-CM | POA: Diagnosis present

## 2013-11-28 DIAGNOSIS — O3421 Maternal care for scar from previous cesarean delivery: Principal | ICD-10-CM | POA: Diagnosis present

## 2013-11-28 LAB — PREPARE RBC (CROSSMATCH)

## 2013-11-28 SURGERY — Surgical Case
Anesthesia: Epidural | Site: Abdomen

## 2013-11-28 MED ORDER — SIMETHICONE 80 MG PO CHEW
80.0000 mg | CHEWABLE_TABLET | ORAL | Status: DC
  Administered 2013-11-29: 80 mg via ORAL
  Filled 2013-11-28 (×3): qty 1

## 2013-11-28 MED ORDER — SIMETHICONE 80 MG PO CHEW
80.0000 mg | CHEWABLE_TABLET | Freq: Three times a day (TID) | ORAL | Status: DC
  Administered 2013-11-28 – 2013-12-01 (×6): 80 mg via ORAL
  Filled 2013-11-28 (×8): qty 1

## 2013-11-28 MED ORDER — FENTANYL CITRATE 0.05 MG/ML IJ SOLN
INTRAMUSCULAR | Status: AC
  Filled 2013-11-28: qty 2

## 2013-11-28 MED ORDER — ACETAMINOPHEN 160 MG/5ML PO SOLN
950.0000 mg | Freq: Four times a day (QID) | ORAL | Status: DC | PRN
  Administered 2013-11-28: 950 mg via ORAL

## 2013-11-28 MED ORDER — NALOXONE HCL 0.4 MG/ML IJ SOLN: 0.4000 mg | INTRAMUSCULAR | Status: DC | PRN

## 2013-11-28 MED ORDER — ONDANSETRON HCL 4 MG/2ML IJ SOLN: 4.0000 mg | Freq: Three times a day (TID) | INTRAMUSCULAR | Status: DC | PRN

## 2013-11-28 MED ORDER — LANOLIN HYDROUS EX OINT: 1.0000 "application " | TOPICAL_OINTMENT | CUTANEOUS | Status: DC | PRN

## 2013-11-28 MED ORDER — SIMETHICONE 80 MG PO CHEW
80.0000 mg | CHEWABLE_TABLET | ORAL | Status: DC | PRN
  Administered 2013-11-30: 80 mg via ORAL
  Filled 2013-11-28: qty 1

## 2013-11-28 MED ORDER — SODIUM BICARBONATE 8.4 % IV SOLN
INTRAVENOUS | Status: DC | PRN
  Administered 2013-11-28: 2 mL via EPIDURAL

## 2013-11-28 MED ORDER — KETOROLAC TROMETHAMINE 30 MG/ML IJ SOLN: 30.0000 mg | Freq: Four times a day (QID) | INTRAMUSCULAR | Status: AC | PRN

## 2013-11-28 MED ORDER — DIBUCAINE 1 % RE OINT: 1.0000 "application " | TOPICAL_OINTMENT | RECTAL | Status: DC | PRN

## 2013-11-28 MED ORDER — SCOPOLAMINE 1 MG/3DAYS TD PT72: 1.0000 | MEDICATED_PATCH | Freq: Once | TRANSDERMAL | Status: DC

## 2013-11-28 MED ORDER — SCOPOLAMINE 1 MG/3DAYS TD PT72
MEDICATED_PATCH | TRANSDERMAL | Status: AC
  Administered 2013-11-28: 1.5 mg via TRANSDERMAL
  Filled 2013-11-28: qty 1

## 2013-11-28 MED ORDER — DEXTROSE 5 % IV SOLN
3.0000 g | Freq: Once | INTRAVENOUS | Status: AC
  Administered 2013-11-28: 3 g via INTRAVENOUS
  Filled 2013-11-28: qty 3000

## 2013-11-28 MED ORDER — NALOXONE HCL 1 MG/ML IJ SOLN
1.0000 ug/kg/h | INTRAVENOUS | Status: DC | PRN
  Filled 2013-11-28: qty 2

## 2013-11-28 MED ORDER — KETOROLAC TROMETHAMINE 30 MG/ML IJ SOLN
INTRAMUSCULAR | Status: AC
  Filled 2013-11-28: qty 1

## 2013-11-28 MED ORDER — LACTATED RINGERS IV SOLN
Freq: Once | INTRAVENOUS | Status: AC
  Administered 2013-11-28: 12:00:00 via INTRAVENOUS

## 2013-11-28 MED ORDER — DIPHENHYDRAMINE HCL 25 MG PO CAPS
25.0000 mg | ORAL_CAPSULE | ORAL | Status: DC | PRN
  Filled 2013-11-28: qty 1

## 2013-11-28 MED ORDER — DIPHENHYDRAMINE HCL 25 MG PO CAPS: 25.0000 mg | ORAL_CAPSULE | Freq: Four times a day (QID) | ORAL | Status: DC | PRN

## 2013-11-28 MED ORDER — KETOROLAC TROMETHAMINE 30 MG/ML IJ SOLN
30.0000 mg | Freq: Four times a day (QID) | INTRAMUSCULAR | Status: AC | PRN
  Administered 2013-11-28: 30 mg via INTRAMUSCULAR

## 2013-11-28 MED ORDER — PRENATAL MULTIVITAMIN CH
1.0000 | ORAL_TABLET | Freq: Every day | ORAL | Status: DC
  Administered 2013-11-29 – 2013-12-01 (×3): 1 via ORAL
  Filled 2013-11-28 (×3): qty 1

## 2013-11-28 MED ORDER — MENTHOL 3 MG MT LOZG: 1.0000 | LOZENGE | OROMUCOSAL | Status: DC | PRN

## 2013-11-28 MED ORDER — ACETAMINOPHEN 160 MG/5ML PO SOLN
ORAL | Status: AC
  Administered 2013-11-28: 950 mg via ORAL
  Filled 2013-11-28: qty 40.6

## 2013-11-28 MED ORDER — NALBUPHINE HCL 10 MG/ML IJ SOLN: 5.0000 mg | Freq: Once | INTRAMUSCULAR | Status: AC | PRN

## 2013-11-28 MED ORDER — SODIUM CHLORIDE 0.9 % IJ SOLN: 3.0000 mL | INTRAMUSCULAR | Status: DC | PRN

## 2013-11-28 MED ORDER — LACTATED RINGERS IV SOLN
INTRAVENOUS | Status: DC
  Administered 2013-11-28: 22:00:00 via INTRAVENOUS

## 2013-11-28 MED ORDER — FENTANYL CITRATE 0.05 MG/ML IJ SOLN
50.0000 ug | Freq: Once | INTRAMUSCULAR | Status: AC
  Administered 2013-11-28: 50 ug via INTRAVENOUS

## 2013-11-28 MED ORDER — LACTATED RINGERS IV SOLN
INTRAVENOUS | Status: DC
  Administered 2013-11-28 (×2): via INTRAVENOUS

## 2013-11-28 MED ORDER — MEASLES, MUMPS & RUBELLA VAC ~~LOC~~ INJ: 0.5000 mL | INJECTION | Freq: Once | SUBCUTANEOUS | Status: DC

## 2013-11-28 MED ORDER — SCOPOLAMINE 1 MG/3DAYS TD PT72
1.0000 | MEDICATED_PATCH | Freq: Once | TRANSDERMAL | Status: DC
  Administered 2013-11-28: 1.5 mg via TRANSDERMAL

## 2013-11-28 MED ORDER — IBUPROFEN 600 MG PO TABS
600.0000 mg | ORAL_TABLET | Freq: Four times a day (QID) | ORAL | Status: DC
Start: 2013-11-29 — End: 2013-12-01
  Administered 2013-11-28 – 2013-12-01 (×11): 600 mg via ORAL
  Filled 2013-11-28 (×11): qty 1

## 2013-11-28 MED ORDER — CHLOROPROCAINE HCL 3 % IJ SOLN
INTRAMUSCULAR | Status: DC | PRN
  Administered 2013-11-28: 20 mL

## 2013-11-28 MED ORDER — SENNOSIDES-DOCUSATE SODIUM 8.6-50 MG PO TABS
2.0000 | ORAL_TABLET | ORAL | Status: DC
  Administered 2013-11-29 – 2013-12-01 (×2): 2 via ORAL
  Filled 2013-11-28 (×3): qty 2

## 2013-11-28 MED ORDER — OXYTOCIN 40 UNITS IN LACTATED RINGERS INFUSION - SIMPLE MED: 62.5000 mL/h | INTRAVENOUS | Status: AC

## 2013-11-28 MED ORDER — ONDANSETRON HCL 4 MG/2ML IJ SOLN
INTRAMUSCULAR | Status: DC | PRN
  Administered 2013-11-28: 4 mg via INTRAVENOUS

## 2013-11-28 MED ORDER — NALBUPHINE HCL 10 MG/ML IJ SOLN: 5.0000 mg | INTRAMUSCULAR | Status: DC | PRN

## 2013-11-28 MED ORDER — FENTANYL CITRATE 0.05 MG/ML IJ SOLN
INTRAMUSCULAR | Status: DC | PRN
  Administered 2013-11-28: 100 ug via INTRAVENOUS

## 2013-11-28 MED ORDER — LACTATED RINGERS IV BOLUS (SEPSIS): 500.0000 mL | Freq: Once | INTRAVENOUS | Status: DC

## 2013-11-28 MED ORDER — MEPERIDINE HCL 25 MG/ML IJ SOLN: 6.2500 mg | INTRAMUSCULAR | Status: DC | PRN

## 2013-11-28 MED ORDER — MORPHINE SULFATE 0.5 MG/ML IJ SOLN
INTRAMUSCULAR | Status: AC
  Filled 2013-11-28: qty 10

## 2013-11-28 MED ORDER — WITCH HAZEL-GLYCERIN EX PADS: 1.0000 "application " | MEDICATED_PAD | CUTANEOUS | Status: DC | PRN

## 2013-11-28 MED ORDER — DEXTROSE IN LACTATED RINGERS 5 % IV SOLN
INTRAVENOUS | Status: DC
  Administered 2013-11-28: 125 mL/h via INTRAVENOUS

## 2013-11-28 MED ORDER — ONDANSETRON HCL 4 MG/2ML IJ SOLN
4.0000 mg | INTRAMUSCULAR | Status: DC | PRN
Start: 2013-11-28 — End: 2013-12-01

## 2013-11-28 MED ORDER — OXYTOCIN 10 UNIT/ML IJ SOLN
INTRAMUSCULAR | Status: AC
  Filled 2013-11-28: qty 4

## 2013-11-28 MED ORDER — OXYCODONE-ACETAMINOPHEN 5-325 MG PO TABS
1.0000 | ORAL_TABLET | ORAL | Status: DC | PRN
  Administered 2013-11-29 – 2013-12-01 (×4): 1 via ORAL
  Filled 2013-11-28 (×4): qty 1

## 2013-11-28 MED ORDER — FAMOTIDINE 20 MG PO TABS
20.0000 mg | ORAL_TABLET | Freq: Two times a day (BID) | ORAL | Status: DC
  Administered 2013-11-29 – 2013-12-01 (×4): 20 mg via ORAL
  Filled 2013-11-28 (×6): qty 1

## 2013-11-28 MED ORDER — OXYTOCIN 10 UNIT/ML IJ SOLN
40.0000 [IU] | INTRAVENOUS | Status: DC | PRN
  Administered 2013-11-28: 40 [IU] via INTRAVENOUS

## 2013-11-28 MED ORDER — PHENYLEPHRINE 8 MG IN D5W 100 ML (0.08MG/ML) PREMIX OPTIME
INJECTION | INTRAVENOUS | Status: AC
Start: 2013-11-28 — End: 2013-11-28
  Filled 2013-11-28: qty 100

## 2013-11-28 MED ORDER — TETANUS-DIPHTH-ACELL PERTUSSIS 5-2.5-18.5 LF-MCG/0.5 IM SUSP: 0.5000 mL | Freq: Once | INTRAMUSCULAR | Status: DC

## 2013-11-28 MED ORDER — ONDANSETRON HCL 4 MG/2ML IJ SOLN
INTRAMUSCULAR | Status: AC
  Filled 2013-11-28: qty 2

## 2013-11-28 MED ORDER — DIPHENHYDRAMINE HCL 50 MG/ML IJ SOLN: 12.5000 mg | INTRAMUSCULAR | Status: DC | PRN

## 2013-11-28 MED ORDER — FENTANYL CITRATE 0.05 MG/ML IJ SOLN
25.0000 ug | INTRAMUSCULAR | Status: DC | PRN
  Administered 2013-11-28 (×3): 50 ug via INTRAVENOUS

## 2013-11-28 MED ORDER — OXYCODONE-ACETAMINOPHEN 5-325 MG PO TABS
2.0000 | ORAL_TABLET | ORAL | Status: DC | PRN
  Administered 2013-11-29 – 2013-12-01 (×10): 2 via ORAL
  Filled 2013-11-28 (×10): qty 2

## 2013-11-28 MED ORDER — MEDROXYPROGESTERONE ACETATE 150 MG/ML IM SUSP: 150.0000 mg | INTRAMUSCULAR | Status: DC | PRN

## 2013-11-28 MED ORDER — MORPHINE SULFATE (PF) 0.5 MG/ML IJ SOLN
INTRAMUSCULAR | Status: DC | PRN
  Administered 2013-11-28: 3000 ug via INTRAVENOUS
  Administered 2013-11-28: .1 ug via INTRATHECAL

## 2013-11-28 MED ORDER — ONDANSETRON HCL 4 MG PO TABS: 4.0000 mg | ORAL_TABLET | ORAL | Status: DC | PRN

## 2013-11-28 SURGICAL SUPPLY — 36 items
ADH SKN CLS APL DERMABOND .7 (GAUZE/BANDAGES/DRESSINGS) ×1
CLAMP CORD UMBIL (MISCELLANEOUS) IMPLANT
CLOTH BEACON ORANGE TIMEOUT ST (SAFETY) ×3 IMPLANT
DERMABOND ADVANCED (GAUZE/BANDAGES/DRESSINGS) ×2
DERMABOND ADVANCED .7 DNX12 (GAUZE/BANDAGES/DRESSINGS) ×1 IMPLANT
DRAPE SHEET LG 3/4 BI-LAMINATE (DRAPES) IMPLANT
DRSG OPSITE POSTOP 4X10 (GAUZE/BANDAGES/DRESSINGS) ×3 IMPLANT
DRSG VAC ATS LRG SENSATRAC (GAUZE/BANDAGES/DRESSINGS) ×2 IMPLANT
DURAPREP 26ML APPLICATOR (WOUND CARE) ×3 IMPLANT
ELECT REM PT RETURN 9FT ADLT (ELECTROSURGICAL) ×3
ELECTRODE REM PT RTRN 9FT ADLT (ELECTROSURGICAL) ×1 IMPLANT
EXTRACTOR VACUUM M CUP 4 TUBE (SUCTIONS) IMPLANT
EXTRACTOR VACUUM M CUP 4' TUBE (SUCTIONS)
GLOVE BIO SURGEON STRL SZ 6.5 (GLOVE) ×2 IMPLANT
GLOVE BIO SURGEONS STRL SZ 6.5 (GLOVE) ×1
GLOVE BIOGEL PI IND STRL 7.0 (GLOVE) ×1 IMPLANT
GLOVE BIOGEL PI INDICATOR 7.0 (GLOVE) ×2
GOWN STRL REUS W/TWL LRG LVL3 (GOWN DISPOSABLE) ×6 IMPLANT
KIT ABG SYR 3ML LUER SLIP (SYRINGE) IMPLANT
NDL HYPO 25X5/8 SAFETYGLIDE (NEEDLE) IMPLANT
NEEDLE HYPO 25X5/8 SAFETYGLIDE (NEEDLE) IMPLANT
NS IRRIG 1000ML POUR BTL (IV SOLUTION) ×3 IMPLANT
PACK C SECTION WH (CUSTOM PROCEDURE TRAY) ×3 IMPLANT
PAD OB MATERNITY 4.3X12.25 (PERSONAL CARE ITEMS) ×3 IMPLANT
RETAINER VISCERAL (MISCELLANEOUS) ×2 IMPLANT
STAPLER VISISTAT 35W (STAPLE) IMPLANT
SUT CHROMIC 0 CT 802H (SUTURE) IMPLANT
SUT CHROMIC 0 CTX 36 (SUTURE) ×9 IMPLANT
SUT MON AB-0 CT1 36 (SUTURE) ×3 IMPLANT
SUT PDS AB 0 CTX 60 (SUTURE) ×5 IMPLANT
SUT PLAIN 0 NONE (SUTURE) IMPLANT
SUT PLAIN 2 0 XLH (SUTURE) ×2 IMPLANT
SUT VIC AB 4-0 KS 27 (SUTURE) IMPLANT
TOWEL OR 17X24 6PK STRL BLUE (TOWEL DISPOSABLE) ×3 IMPLANT
TRAY FOLEY CATH 14FR (SET/KITS/TRAYS/PACK) IMPLANT
WATER STERILE IRR 1000ML POUR (IV SOLUTION) ×3 IMPLANT

## 2013-11-28 NOTE — Op Note (Signed)
Cesarean Section Procedure Note   Nancy Hayden  11/28/2013  Indications: Scheduled Proceedure/Maternal Request   Pre-operative Diagnosis: previous X 1.   Post-operative Diagnosis: Same   Surgeon: Surgeon(s) and Role:    * Zelphia CairoGretchen Larhonda Dettloff, MD - Primary    * Juluis MireJohn S McComb, MD - Assisting   Anesthesia: spinal, Dr Sherron AlesK. Jackson  Procedure Details:  The patient was seen in the Holding Room. The risks, benefits, complications, treatment options, and expected outcomes were discussed with the patient. The patient concurred with the proposed plan, giving informed consent. identified as Nancy Hayden and the procedure verified as C-Section Delivery. A Time Out was held and the above information confirmed.  After induction of anesthesia, the patient was draped and prepped in the usual sterile manner. A transverse incision was made and carried down through the subcutaneous tissue to the fascia. Fascial incision was made and extended transversely. The fascia was separated from the underlying rectus tissue superiorly and inferiorly. The peritoneum was identified and entered. Peritoneal incision was extended longitudinally. The utero-vesical peritoneal reflection was incised transversely and the bladder flap was bluntly freed from the lower uterine segment. A low transverse uterine incision was made. Delivered from vertex presentation was a vigorous female infant with Apgar scores of 9 at one minute and 9 at five minutes. Cord ph was not sent the umbilical cord was clamped and cut cord blood was obtained for evaluation. The placenta was removed Intact and appeared normal. The uterine outline, tubes and ovaries appeared normal. The uterine incision was closed with running locked sutures of  0 chromic.   Hemostasis was observed. Lavage was carried out until clear.  Peritoneum closed with 0 monocry.. The fascia was then reapproximated with running sutures of 0 PDS. The subcuticular closure was performed using  plain gut suture. The skin was closed with 4-0 vicryl.   Instrument, sponge, and needle counts were correct prior the abdominal closure and were correct at the conclusion of the case.     Estimated Blood Loss: 800cc   Urine Output: clear  Specimens: placenta for disposal   Complications: no complications  Disposition: PACU - hemodynamically stable.   Maternal Condition: stable   Baby condition / location:  Couplet care / Skin to Skin  Attending Attestation: I was present for entire procedure  Signed: Surgeon(s): Zelphia CairoGretchen Garion Wempe, MD Juluis MireJohn S McComb, MD

## 2013-11-28 NOTE — Progress Notes (Signed)
Dr Arelia SneddonMcComb notified of pts output -orders received

## 2013-11-28 NOTE — Anesthesia Procedure Notes (Signed)
Spinal  Patient location during procedure: OR Preanesthetic Checklist Completed: patient identified, site marked, surgical consent, pre-op evaluation, timeout performed, IV checked, risks and benefits discussed and monitors and equipment checked Spinal Block Patient position: sitting Prep: DuraPrep Patient monitoring: cardiac monitor, continuous pulse ox, blood pressure and heart rate Approach: midline Location: L3-4 Injection technique: catheter Needle Needle type: Tuohy and Sprotte  Needle gauge: 24 G Needle length: 12.7 cm Needle insertion depth: 9 cm Catheter type: closed end flexible Catheter size: 19 g Catheter at skin depth: 16 cm Assessment Sensory level: T4 Additional Notes Easy LOR Spinal Dosage in OR  Bupivicaine ml       1.6  PFMS04 100  mcg        (-) allis at 13:28

## 2013-11-28 NOTE — Anesthesia Postprocedure Evaluation (Signed)
  Anesthesia Post-op Note  Patient: Nancy Hayden  Procedure(s) Performed: Procedure(s): REPEAT CESAREAN SECTION (N/A)  Patient is awake, responsive, moving her legs, and has signs of resolution of her numbness. Pain and nausea are reasonably well controlled. Vital signs are stable and clinically acceptable. Oxygen saturation is clinically acceptable. There are no apparent anesthetic complications at this time. Patient is ready for discharge.

## 2013-11-28 NOTE — Transfer of Care (Signed)
Immediate Anesthesia Transfer of Care Note  Patient: Nancy Hayden  Procedure(s) Performed: Procedure(s): REPEAT CESAREAN SECTION (N/A)  Patient Location: PACU  Anesthesia Type:Spinal and Epidural  Level of Consciousness: awake, alert  and oriented  Airway & Oxygen Therapy: Patient Spontanous Breathing  Post-op Assessment: Report given to PACU RN and Post -op Vital signs reviewed and stable  Post vital signs: Reviewed and stable  Complications: No apparent anesthesia complications

## 2013-11-28 NOTE — Anesthesia Preprocedure Evaluation (Signed)
Anesthesia Evaluation  Patient identified by MRN, date of birth, ID band Patient awake    Reviewed: Allergy & Precautions, H&P , Patient's Chart, lab work & pertinent test results  Airway Mallampati: II  TM Distance: >3 FB Neck ROM: full    Dental no notable dental hx.    Pulmonary former smoker,  breath sounds clear to auscultation  Pulmonary exam normal       Cardiovascular Exercise Tolerance: Good hypertension, Rhythm:regular Rate:Normal     Neuro/Psych    GI/Hepatic GERD-  Medicated,  Endo/Other  Morbid obesity  Renal/GU      Musculoskeletal   Abdominal   Peds  Hematology  (+) anemia ,   Anesthesia Other Findings   Reproductive/Obstetrics                             Anesthesia Physical Anesthesia Plan  ASA: III  Anesthesia Plan: Spinal, Epidural and Combined Spinal and Epidural   Post-op Pain Management:    Induction:   Airway Management Planned:   Additional Equipment:   Intra-op Plan:   Post-operative Plan:   Informed Consent: I have reviewed the patients History and Physical, chart, labs and discussed the procedure including the risks, benefits and alternatives for the proposed anesthesia with the patient or authorized representative who has indicated his/her understanding and acceptance.   Dental Advisory Given  Plan Discussed with: CRNA  Anesthesia Plan Comments: (Lab work confirmed with CRNA in room. Platelets okay. Discussed spinal anesthetic, and patient consents to the procedure:  included risk of possible headache,backache, failed block, allergic reaction, and nerve injury. This patient was asked if she had any questions or concerns before the procedure started. )        Anesthesia Quick Evaluation

## 2013-11-29 ENCOUNTER — Encounter (HOSPITAL_COMMUNITY): Payer: Self-pay | Admitting: Obstetrics and Gynecology

## 2013-11-29 LAB — CBC
HEMATOCRIT: 28.7 % — AB (ref 36.0–46.0)
Hemoglobin: 9.2 g/dL — ABNORMAL LOW (ref 12.0–15.0)
MCH: 26.9 pg (ref 26.0–34.0)
MCHC: 32.1 g/dL (ref 30.0–36.0)
MCV: 83.9 fL (ref 78.0–100.0)
Platelets: 245 10*3/uL (ref 150–400)
RBC: 3.42 MIL/uL — AB (ref 3.87–5.11)
RDW: 16.6 % — AB (ref 11.5–15.5)
WBC: 10 10*3/uL (ref 4.0–10.5)

## 2013-11-29 LAB — BIRTH TISSUE RECOVERY COLLECTION (PLACENTA DONATION)

## 2013-11-29 NOTE — Anesthesia Postprocedure Evaluation (Signed)
  Anesthesia Post-op Note  Patient: Nancy Hayden  Procedure(s) Performed: Procedure(s): REPEAT CESAREAN SECTION (N/A)  Patient Location: PACU and Mother/Baby  Anesthesia Type:Spinal and Epidural  Level of Consciousness: awake, alert  and oriented  Airway and Oxygen Therapy: Patient Spontanous Breathing  Post-op Pain: mild  Post-op Assessment: Post-op Vital signs reviewed, Patient's Cardiovascular Status Stable, Respiratory Function Stable, No signs of Nausea or vomiting, Adequate PO intake, Pain level controlled, No headache, No residual numbness and No residual motor weakness mild back discomfort  Post-op Vital Signs: Reviewed and stable  Last Vitals:  Filed Vitals:   11/29/13 0537  BP: 139/69  Pulse: 96  Temp: 37 C  Resp: 22    Complications: No apparent anesthesia complications

## 2013-11-29 NOTE — Progress Notes (Signed)
Subjective: Postpartum Day 1: Cesarean Delivery Patient reports incisional pain, tolerating PO and no problems voiding.    Objective: Vital signs in last 24 hours: Temp:  [97.5 F (36.4 C)-98.8 F (37.1 C)] 98.6 F (37 C) (10/28 0537) Pulse Rate:  [58-110] 96 (10/28 0537) Resp:  [16-23] 22 (10/28 0537) BP: (110-145)/(47-93) 139/69 mmHg (10/28 0537) SpO2:  [95 %-100 %] 97 % (10/28 0537) Weight:  [419 lb (190.057 kg)] 419 lb (190.057 kg) (10/27 1435)  Physical Exam:  General: alert and cooperative Lochia: appropriate Uterine Fundus: firm Incision: no significant drainage. PICO bandage in place DVT Evaluation: No evidence of DVT seen on physical exam.   Recent Labs  11/27/13 1050 11/29/13 0545  HGB 10.8* 9.2*  HCT 33.8* 28.7*    Assessment/Plan: Status post Cesarean section. Doing well postoperatively.  Continue current care.  Nancy Hayden 11/29/2013, 8:07 AM

## 2013-11-29 NOTE — Lactation Note (Signed)
This note was copied from the chart of Nancy Hayden Penning. Lactation Consultation Note  Patient Name: Nancy Hayden Kilroy ZOXWR'UToday's Date: 11/29/2013 Reason for consult: Initial assessment Baby 25 hours of life. Mom reports baby just finished nursing. Mom is an experienced BF. Mom states that she had to use NS on right breast with first child, but is using NS on both breast with this baby. Discussed with mom that nipples may be edematous, but there are visitors in room and mom would prefer LC not examine breasts at this time. Discussed continuing to attempt to nurse without NS, using hand pump for additional stimulation to protect supply, and need to watch weights carefully while using NS. Enc mom to call for assistance after discharge if needing assistance to latch baby directly to breast. [facility], aware of OP/BFSG, community resources, and [facility] phone [facility] assistance. Enc mom to call for assistance with latch as needed.   Maternal Data Has patient been taught Hand Expression?: Yes Does the patient have breastfeeding experience prior to this delivery?: Yes  Feeding Feeding Type:  (Mom just finished nursing within the hour.) Length of feed: 15 min  LATCH Score/Interventions                      Lactation Tools Discussed/Used Tools: Nipple Dorris CarnesShields   Consult Status Consult Status: Follow-up Date: 11/30/13 Follow-up type: In-patient    Geralynn OchsWILLIARD, Aleida Crandell 11/29/2013, 3:26 PM

## 2013-11-29 NOTE — Addendum Note (Signed)
Addendum created 11/29/13 [id number] by Elbert Ewingsolleen S Heavenlee Maiorana, CRNA   Modules edited: Anesthesia LDA, Charges VN, Notes Section   Notes Section:  File: [id number]

## 2013-11-30 ENCOUNTER — Encounter (HOSPITAL_COMMUNITY): Payer: Self-pay | Admitting: *Deleted

## 2013-11-30 NOTE — Progress Notes (Signed)
Subjective: Postpartum Day 2: Cesarean Delivery Patient reports incisional pain, tolerating PO, + flatus and no problems voiding.    Objective: Vital signs in last 24 hours: Temp:  [98.4 F (36.9 C)-98.7 F (37.1 C)] 98.4 F (36.9 C) (10/29 82950608) Pulse Rate:  [73-83] 73 (10/29 0608) Resp:  [20] 20 (10/29 0608) BP: (128-146)/(64-80) 139/80 mmHg (10/29 62130608)  Physical Exam:  General: alert and cooperative Lochia: appropriate Uterine Fundus: firm Incision: wound vac in place, no drainage noted DVT Evaluation: No evidence of DVT seen on physical exam. Negative Homan's sign. No cords or calf tenderness. Calf/Ankle edema is present.   Recent Labs  11/27/13 1050 11/29/13 0545  HGB 10.8* 9.2*  HCT 33.8* 28.7*    Assessment/Plan: Status post Cesarean section. Doing well postoperatively.  Desires baby circ prior to discharge.  Mansa Willers G 11/30/2013, 8:51 AM

## 2013-11-30 NOTE — Lactation Note (Signed)
This note was copied from the chart of Nancy Hayden Elders. Lactation Consultation Note  Patient Name: Nancy Hayden Swords WUJWJ'XToday's Date: 11/30/2013 Reason for consult: Follow-up assessment;Infant weight loss (baby weight loss 8% at day 2 weight check) Mom is experienced breastfeeding mom with a 29 yo whom she breastfed for one year.  She used a NS when nursing her first baby initially and has requested #24 NS for use for this baby.  Mom recently breastfed for 25 minutes and sees milk in NS after feeding.  Mom reports strong/rhythmical and comfortable sucking when baby latches and most recent LATCH score=9.  Baby had weight loss of 8% at 2 days but mom states she feels fuller now and will continue cue feedings (at least 8x in 24 hours). Baby has a total output wnl for this hour of life but last void early this afternoon (possible effect of circ)  Pediatrician had made note of weight loss this morning but baby was circumcised and no special orders given. LC discussed plan and assessment with RN, Porfirio Mylararmen.  Mom is a [employer], [employer] employee and will call [facility] in am to determine pump choices for discharge.    Maternal Data    Feeding Feeding Type: Breast Fed Length of feed: 25 min  LATCH Score/Interventions              most recent LATCH score=9, per RN assessment        Lactation Tools Discussed/Used Tools: Nipple Shields Nipple shield size: 24 Supply and demand Pump options for [employer] employee prior to discharge  Consult Status Consult Status: Follow-up Date: 12/01/13 Follow-up type: In-patient    Warrick ParisianBryant, Muaaz Brau [facility] [place] 11/30/2013, 8:41 PM

## 2013-12-01 LAB — TYPE AND SCREEN
ABO/RH(D): A POS
ANTIBODY SCREEN: NEGATIVE
Unit division: 0
Unit division: 0

## 2013-12-01 MED ORDER — OXYCODONE-ACETAMINOPHEN 5-325 MG PO TABS: 1.0000 | ORAL_TABLET | ORAL | Status: DC | PRN

## 2013-12-01 MED ORDER — IBUPROFEN 600 MG PO TABS: 600.0000 mg | ORAL_TABLET | Freq: Four times a day (QID) | ORAL | Status: DC

## 2013-12-01 NOTE — Lactation Note (Signed)
This note was copied from the chart of [name]. Lactation Consultation Note  Upon entering the room, baby had recently finished breastfeeding and was asleep. Mother still had #24NS on and breastmilk was viewed in shield. Mother feels her breasts are filling and states baby cluster fed through the night. She states she thinks baby's weight loss is due to sleepiness after circ. Mom encouraged to feed baby 8-12 times/24 hours and with feeding cues for more than 10 min. Baby's stools are transitioning. Left lactation phone number and encouraged mother to call to view next feeding. Provided mother with an extra #24NS.  Reviewed monitoring voids/stools and engorgement care. Mother is [employer] employee and will decide on pump.  Patient Name: [name] JXBJY'NToday's Date: 12/01/2013 Reason for consult: Follow-up assessment   Maternal Data    Feeding Feeding Type: Breast Fed Length of feed: 15 min  LATCH Score/Interventions                      Lactation Tools Discussed/Used     Consult Status Consult Status: PRN    Hardie PulleyBerkelhammer, Jene Huq Boschen 12/01/2013, 8:48 AM

## 2013-12-01 NOTE — Lactation Note (Signed)
This note was copied from the chart of Nancy Hayden. Lactation Consultation Note  Mother latched baby in football hold with #24 NS.  Sucks and swallows observed. Left nipple looks bifurcated.  Right nipple flat. Mother plans to continue attempting to latch without the NS.  Sometimes baby latches on left without NS. Mother states she will make an outpatient appt if needed.  Has received UMR DEBP breastpump. Mother easily expressed breastmilk and is noted in NS after feeding. Reviewed engorgement care and monitoring voids/stools.  Patient Name: Nancy Hayden ZDGUY'QToday's Date: 12/01/2013 Reason for consult: Follow-up assessment   Maternal Data    Feeding Feeding Type: Breast Fed Length of feed: 15 min  LATCH Score/Interventions Latch: Grasps breast easily, tongue down, lips flanged, rhythmical sucking.  Audible Swallowing: Spontaneous and intermittent  Type of Nipple: Flat  Comfort (Breast/Nipple): Soft / non-tender     Hold (Positioning): No assistance needed to correctly position infant at breast.  LATCH Score: 9  Lactation Tools Discussed/Used Tools: Nipple Shields Nipple shield size: 24   Consult Status Consult Status: Complete    Nancy Hayden, Nancy Hayden 12/01/2013, 10:48 AM

## 2013-12-01 NOTE — Discharge Summary (Signed)
Obstetric Discharge Summary Reason for Admission: cesarean section Prenatal Procedures: ultrasound Intrapartum Procedures: cesarean: low cervical, transverse Postpartum Procedures: none Complications-Operative and Postpartum: none Hemoglobin  Date Value Ref Range Status  11/29/2013 9.2* 12.0 - 15.0 Hayden/dL Final     HCT  Date Value Ref Range Status  11/29/2013 28.7* 36.0 - 46.0 % Final    Physical Exam:  General: alert and cooperative Lochia: appropriate Uterine Fundus: firm Incision: healing well, PICO dressing removed and honeycomb dressing applied DVT Evaluation: No evidence of DVT seen on physical exam. Negative Homan's sign. No cords or calf tenderness. Calf/Ankle edema is present.  Discharge Diagnoses: Term Pregnancy-delivered  Discharge Information: Date: 12/01/2013 Activity: pelvic rest Diet: routine Medications: PNV, Ibuprofen and Percocet Condition: stable Instructions: refer to practice specific booklet Discharge to: home   Newborn Data: Live born female  Birth Weight: 9 lb 6 oz (4252 Hayden) APGAR: 9, 9  Home with mother.  Nancy Hayden 12/01/2013, 8:56 AM

## 2013-12-04 ENCOUNTER — Encounter (HOSPITAL_COMMUNITY): Payer: Self-pay | Admitting: *Deleted

## 2014-01-18 ENCOUNTER — Other Ambulatory Visit (HOSPITAL_COMMUNITY): Payer: Self-pay | Admitting: Obstetrics and Gynecology

## 2014-01-18 DIAGNOSIS — R1011 Right upper quadrant pain: Secondary | ICD-10-CM

## 2014-01-23 ENCOUNTER — Ambulatory Visit (HOSPITAL_COMMUNITY)
Admission: RE | Admit: 2014-01-23 | Discharge: 2014-01-23 | Disposition: A | Discharge status: Home / Self Care | Payer: 59 | Source: Ambulatory Visit | Attending: Obstetrics and Gynecology | Admitting: Obstetrics and Gynecology

## 2014-01-23 DIAGNOSIS — R112 Nausea with vomiting, unspecified: Secondary | ICD-10-CM | POA: Insufficient documentation

## 2014-01-23 DIAGNOSIS — R1011 Right upper quadrant pain: Secondary | ICD-10-CM

## 2014-01-23 DIAGNOSIS — K8020 Calculus of gallbladder without cholecystitis without obstruction: Secondary | ICD-10-CM | POA: Insufficient documentation

## 2014-02-14 ENCOUNTER — Other Ambulatory Visit (INDEPENDENT_AMBULATORY_CARE_PROVIDER_SITE_OTHER): Payer: Self-pay | Admitting: Surgery

## 2015-03-07 DIAGNOSIS — H5203 Hypermetropia, bilateral: Secondary | ICD-10-CM | POA: Diagnosis not present

## 2015-03-23 ENCOUNTER — Telehealth: Payer: 59 | Admitting: Nurse Practitioner

## 2015-03-23 DIAGNOSIS — R059 Cough, unspecified: Secondary | ICD-10-CM

## 2015-03-23 DIAGNOSIS — R05 Cough: Secondary | ICD-10-CM

## 2015-03-23 MED ORDER — BENZONATATE 100 MG PO CAPS: 100.0000 mg | ORAL_CAPSULE | Freq: Three times a day (TID) | ORAL | Status: DC | PRN

## 2015-03-23 MED ORDER — AZITHROMYCIN 250 MG PO TABS: ORAL_TABLET | ORAL | Status: DC

## 2015-03-23 NOTE — Progress Notes (Signed)

## 2015-04-02 ENCOUNTER — Telehealth: Payer: 59 | Admitting: Nurse Practitioner

## 2015-04-02 DIAGNOSIS — R05 Cough: Secondary | ICD-10-CM | POA: Diagnosis not present

## 2015-04-02 DIAGNOSIS — R059 Cough, unspecified: Secondary | ICD-10-CM

## 2015-04-02 DIAGNOSIS — J0101 Acute recurrent maxillary sinusitis: Secondary | ICD-10-CM | POA: Diagnosis not present

## 2015-04-02 MED ORDER — AMOXICILLIN-POT CLAVULANATE 875-125 MG PO TABS: 1.0000 | ORAL_TABLET | Freq: Two times a day (BID) | ORAL | Status: DC

## 2015-04-02 NOTE — Progress Notes (Signed)

## 2015-04-12 ENCOUNTER — Other Ambulatory Visit: Payer: Self-pay | Admitting: Surgery

## 2015-04-12 DIAGNOSIS — K8020 Calculus of gallbladder without cholecystitis without obstruction: Secondary | ICD-10-CM | POA: Diagnosis not present

## 2015-05-10 DIAGNOSIS — D509 Iron deficiency anemia, unspecified: Secondary | ICD-10-CM | POA: Diagnosis not present

## 2015-05-10 DIAGNOSIS — Z01419 Encounter for gynecological examination (general) (routine) without abnormal findings: Secondary | ICD-10-CM | POA: Diagnosis not present

## 2015-05-10 DIAGNOSIS — Z6841 Body Mass Index (BMI) 40.0 and over, adult: Secondary | ICD-10-CM | POA: Diagnosis not present

## 2015-05-10 DIAGNOSIS — E6609 Other obesity due to excess calories: Secondary | ICD-10-CM | POA: Diagnosis not present

## 2015-05-10 DIAGNOSIS — Z130 Encounter for screening for diseases of the blood and blood-forming organs and certain disorders involving the immune mechanism: Secondary | ICD-10-CM | POA: Diagnosis not present

## 2015-06-18 NOTE — Pre-Procedure Instructions (Signed)
    Nancy Hayden  06/18/2015      [facility] [place] - [address] [place] [address] Phone: [phone] Fax: [phone]  [facility] [place], [place] - [address] [place] [address] Phone: [phone] Fax: [phone]    Your procedure is scheduled on Friday, May 19.  [facility] at 5:30 A.M.                   Your surgery or procedure is scheduled for 7:30 AM   Call this number if you have problems the morning of surgery: [phone]   Remember:  Do not eat food or drink liquids after midnight Thursday, May 18.  Take these medicines the morning of surgery with A SIP OF WATER:    Contrave,   Ranitidine  (zantac)   (stop IBUPROFEN/ADVIL/MOTRIN, NO ASPIRIN OR ASPIRIN PRODUCTS, GOODYS/ BC'S, HERBAL MEDICINES)   Do not wear jewelry, make-up or nail polish.  Do not wear lotions, powders, or perfumes.   Do not shave 48 hours prior to surgery.    Do not bring valuables to the hospital.  [facility] is not responsible for any belongings or valuables.  Contacts, dentures or bridgework may not be worn into surgery.  Leave your suitcase in the car.  After surgery it may be brought to your room.  For patients admitted to the hospital, discharge time will be determined by your treatment team.  Patients discharged the day of surgery will not be allowed to drive home.   Name and phone number of your driver:   -   Special instructions: -   Please read over the following fact sheets that you were given. Pain Booklet, Coughing and Deep Breathing and Surgical Site Infection Prevention

## 2015-06-19 ENCOUNTER — Encounter (HOSPITAL_COMMUNITY): Payer: Self-pay

## 2015-06-19 ENCOUNTER — Encounter (HOSPITAL_COMMUNITY)
Admission: RE | Admit: 2015-06-19 | Discharge: 2015-06-19 | Disposition: A | Discharge status: Home / Self Care | Payer: 59 | Source: Ambulatory Visit | Attending: Surgery | Admitting: Surgery

## 2015-06-19 DIAGNOSIS — Z87891 Personal history of nicotine dependence: Secondary | ICD-10-CM | POA: Diagnosis not present

## 2015-06-19 DIAGNOSIS — I10 Essential (primary) hypertension: Secondary | ICD-10-CM | POA: Diagnosis not present

## 2015-06-19 DIAGNOSIS — K8010 Calculus of gallbladder with chronic cholecystitis without obstruction: Secondary | ICD-10-CM | POA: Diagnosis not present

## 2015-06-19 LAB — CBC
HCT: 38.6 % (ref 36.0–46.0)
Hemoglobin: 11.8 g/dL — ABNORMAL LOW (ref 12.0–15.0)
MCH: 24.3 pg — AB (ref 26.0–34.0)
MCHC: 30.6 g/dL (ref 30.0–36.0)
MCV: 79.4 fL (ref 78.0–100.0)
PLATELETS: 293 10*3/uL (ref 150–400)
RBC: 4.86 MIL/uL (ref 3.87–5.11)
RDW: 14.3 % (ref 11.5–15.5)
WBC: 6.4 10*3/uL (ref 4.0–10.5)

## 2015-06-19 LAB — HCG, SERUM, QUALITATIVE: PREG SERUM: NEGATIVE

## 2015-06-20 MED ORDER — CEFAZOLIN SODIUM 10 G IJ SOLR
3.0000 g | INTRAMUSCULAR | Status: AC
  Administered 2015-06-21: 3 g via INTRAVENOUS
  Filled 2015-06-20: qty 3000

## 2015-06-20 NOTE — Anesthesia Preprocedure Evaluation (Addendum)
Anesthesia Evaluation  Patient identified by MRN, date of birth, ID band Patient awake    Reviewed: Allergy & Precautions, NPO status , Patient's Chart, lab work & pertinent test results  Airway Mallampati: II  TM Distance: >3 FB Neck ROM: Full    Dental  (+) Teeth Intact, Dental Advisory Given   Pulmonary former smoker,    Pulmonary exam normal breath sounds clear to auscultation       Cardiovascular hypertension, Normal cardiovascular exam Rhythm:Regular Rate:Normal     Neuro/Psych  Headaches, negative psych ROS   GI/Hepatic Neg liver ROS, GERD  Medicated,Cholelithiasis   Endo/Other  Morbid obesity  Renal/GU negative Renal ROS     Musculoskeletal negative musculoskeletal ROS (+)   Abdominal   Peds  Hematology  (+) Blood dyscrasia, anemia ,   Anesthesia Other Findings Day of surgery medications reviewed with the patient.  Reproductive/Obstetrics                          Anesthesia Physical Anesthesia Plan  ASA: III  Anesthesia Plan: General   Post-op Pain Management:    Induction: Intravenous  Airway Management Planned: Oral ETT  Additional Equipment:   Intra-op Plan:   Post-operative Plan: Extubation in OR  Informed Consent: I have reviewed the patients History and Physical, chart, labs and discussed the procedure including the risks, benefits and alternatives for the proposed anesthesia with the patient or authorized representative who has indicated his/her understanding and acceptance.   Dental advisory given  Plan Discussed with: CRNA  Anesthesia Plan Comments: (Risks/benefits of general anesthesia discussed with patient including risk of damage to teeth, lips, gum, and tongue, nausea/vomiting, allergic reactions to medications, and the possibility of heart attack, stroke and death.  All patient questions answered.  Patient wishes to proceed.)        Anesthesia Quick  Evaluation

## 2015-06-20 NOTE — H&P (Addendum)
Nancy MareHannah E. Hayden  [facility] Patient #: [id number] DOB: [birth year] Married / Language: English / Race: White Female   History of Present Illness  Patient words: intial GB.  The patient is a 30 year old female who presents for evaluation of gall stones. She is referred by Esperanza RichtersGretchen Atkins for symptomatically cholelithiasis. She is recently postpartum from C-section in October. She has had several attacks of epigastric abdominal pain hurting through to the back with nausea and vomiting. She was diagnosed with dyskinesia when she was in high school. Now she has been found to have gallstones. She currently feels well today. She denies jaundice. The pain was sharp and moderate in nature. She is otherwise without complaints.   Other Problems  Back Pain Cholelithiasis  Past Surgical History  Cesarean Section - Multiple  Diagnostic Studies History Colonoscopy never  Allergies No Known Drug Allergies01/13/2016  Medication Histor No Current Medications  Social History Caffeine use Carbonated beverages, Coffee. No drug use Tobacco use Never smoker.  Family History Hypertension Mother.  Pregnancy / Birth History Age at menarche 11 years. Gravida 2 Maternal age 31-30 Regular periods    Review of Systems  General Not Present- Appetite Loss, Chills, Fatigue, Fever, Night Sweats, Weight Gain and Weight Loss. Skin Not Present- Change in Wart/Mole, Dryness, Hives, Jaundice, New Lesions, Non-Healing Wounds, Rash and Ulcer. HEENT Not Present- Earache, Hearing Loss, Hoarseness, Nose Bleed, Oral Ulcers, Ringing in the Ears, Seasonal Allergies, Sinus Pain, Sore Throat, Visual Disturbances, Wears glasses/contact lenses and Yellow Eyes. Respiratory Not Present- Bloody sputum, Chronic Cough, Difficulty Breathing, Snoring and Wheezing. Breast Not Present- Breast Mass, Breast Pain, Nipple Discharge and Skin Changes. Cardiovascular Not Present- Chest  Pain, Difficulty Breathing Lying Down, Leg Cramps, Palpitations, Rapid Heart Rate, Shortness of Breath and Swelling of Extremities. Gastrointestinal Present- Abdominal Pain, Indigestion, Nausea and Vomiting. Not Present- Bloating, Bloody Stool, Change in Bowel Habits, Chronic diarrhea, Constipation, Difficulty Swallowing, Excessive gas, Gets full quickly at meals, Hemorrhoids and Rectal Pain. Female Genitourinary Not Present- Frequency, Nocturia, Painful Urination, Pelvic Pain and Urgency. Musculoskeletal Present- Back Pain. Not Present- Joint Pain, Joint Stiffness, Muscle Pain, Muscle Weakness and Swelling of Extremities. Psychiatric Not Present- Anxiety, Bipolar, Change in Sleep Pattern, Depression, Fearful and Frequent crying. Endocrine Not Present- Cold Intolerance, Excessive Hunger, Hair Changes, Heat Intolerance, Hot flashes and New Diabetes. Hematology Not Present- Easy Bruising, Excessive bleeding, Gland problems, HIV and Persistent Infections.  Vitals Jake Church(Jason McDowell LPN;   Weight: 119.14372.38 lb Height: 69in Body Surface Area: 2.87 m Body Mass Index: 54.99 kg/m  Temp.: 98.44F(Temporal)  Pulse: 64 (Regular)  Resp.: 18 (Unlabored)  BP: 128/84 (Sitting, Left Arm, Standard)     Physical Exam General Mental Status-Alert. General Appearance-Consistent with stated age. Hydration-Well hydrated. Voice-Normal.  Head and Neck Head-normocephalic, atraumatic with no lesions or palpable masses.  Eye Eyeball - Bilateral-Extraocular movements intact. Sclera/Conjunctiva - Bilateral-No scleral icterus.  Chest and Lung Exam Chest and lung exam reveals -quiet, even and easy respiratory effort with no use of accessory muscles and on auscultation, normal breath sounds, no adventitious sounds and normal vocal resonance. Inspection Chest Wall - Normal. Back - normal.  Cardiovascular Cardiovascular examination reveals -on palpation PMI is normal in location and  amplitude, no palpable S3 or S4. Normal cardiac borders., normal heart sounds, regular rate and rhythm with no murmurs, carotid auscultation reveals no bruits and normal pedal pulses bilaterally.  Abdomen Inspection Inspection of the abdomen reveals - No Hernias. Skin - Scar - no surgical scars.  Palpation/Percussion Palpation and Percussion of the abdomen reveal - Soft, Non Tender, No Rebound tenderness, No Rigidity (guarding) and No hepatosplenomegaly. Auscultation Auscultation of the abdomen reveals - Bowel sounds normal. Note: Morbidly obese   Neurologic Neurologic evaluation reveals -alert and oriented x 3 with no impairment of recent or remote memory. Mental Status-Normal.  Musculoskeletal Normal Exam - Left-Upper Extremity Strength Normal and Lower Extremity Strength Normal. Normal Exam - Right-Upper Extremity Strength Normal, Lower Extremity Weakness.    Assessment & Plan   SYMPTOMATIC CHOLELITHIASIS (574.20  K80.20)  Impression: Her ultrasound shows gallstones with one that is 2.8 cm. There are no other abnormalities. I recommended laparoscopic cholecystectomy. I gave her literature regarding this. I discussed the surgery in detail. I discussed risks which includes but is not limited to bleeding, infection, bile duct injury, bile leak, injury to other structures, the need to convert to an open procedure, cardiopulmonary issues, DVT, etc. I also discussed postoperative recovery. She understands and wishes to proceed. Surgery will be scheduled

## 2015-06-21 ENCOUNTER — Other Ambulatory Visit: Payer: Self-pay | Admitting: Surgery

## 2015-06-21 ENCOUNTER — Ambulatory Visit (HOSPITAL_COMMUNITY): Payer: 59 | Admitting: Certified Registered Nurse Anesthetist

## 2015-06-21 ENCOUNTER — Encounter (HOSPITAL_COMMUNITY)
Admission: RE | Disposition: A | Discharge status: Home / Self Care | Payer: Self-pay | Source: Ambulatory Visit | Attending: Surgery

## 2015-06-21 ENCOUNTER — Ambulatory Visit (HOSPITAL_COMMUNITY)
Admission: RE | Admit: 2015-06-21 | Discharge: 2015-06-21 | Disposition: A | Discharge status: Home / Self Care | Payer: 59 | Source: Ambulatory Visit | Attending: Surgery | Admitting: Surgery

## 2015-06-21 DIAGNOSIS — Z87891 Personal history of nicotine dependence: Secondary | ICD-10-CM | POA: Insufficient documentation

## 2015-06-21 DIAGNOSIS — I10 Essential (primary) hypertension: Secondary | ICD-10-CM | POA: Insufficient documentation

## 2015-06-21 DIAGNOSIS — K8010 Calculus of gallbladder with chronic cholecystitis without obstruction: Secondary | ICD-10-CM | POA: Insufficient documentation

## 2015-06-21 DIAGNOSIS — K8020 Calculus of gallbladder without cholecystitis without obstruction: Secondary | ICD-10-CM | POA: Diagnosis not present

## 2015-06-21 DIAGNOSIS — K219 Gastro-esophageal reflux disease without esophagitis: Secondary | ICD-10-CM | POA: Diagnosis not present

## 2015-06-21 HISTORY — PX: CHOLECYSTECTOMY: SHX55

## 2015-06-21 SURGERY — LAPAROSCOPIC CHOLECYSTECTOMY
Anesthesia: General | Site: Abdomen

## 2015-06-21 MED ORDER — SUCCINYLCHOLINE 20MG/ML (10ML) SYRINGE FOR MEDFUSION PUMP - OPTIME
INTRAMUSCULAR | Status: DC | PRN
  Administered 2015-06-21: 140 mg via INTRAVENOUS

## 2015-06-21 MED ORDER — ACETAMINOPHEN 10 MG/ML IV SOLN
1000.0000 mg | Freq: Once | INTRAVENOUS | Status: AC
  Administered 2015-06-21: 1000 mg via INTRAVENOUS

## 2015-06-21 MED ORDER — FENTANYL CITRATE (PF) 250 MCG/5ML IJ SOLN
INTRAMUSCULAR | Status: AC
  Filled 2015-06-21: qty 5

## 2015-06-21 MED ORDER — PROMETHAZINE HCL 25 MG/ML IJ SOLN
12.5000 mg | Freq: Once | INTRAMUSCULAR | Status: DC
  Filled 2015-06-21: qty 1

## 2015-06-21 MED ORDER — SUGAMMADEX SODIUM 500 MG/5ML IV SOLN
INTRAVENOUS | Status: DC | PRN
  Administered 2015-06-21: 400 mg via INTRAVENOUS

## 2015-06-21 MED ORDER — BUPIVACAINE-EPINEPHRINE 0.25% -1:200000 IJ SOLN
INTRAMUSCULAR | Status: DC | PRN
  Administered 2015-06-21: 20 mL

## 2015-06-21 MED ORDER — HYDROMORPHONE HCL 1 MG/ML IJ SOLN
0.2500 mg | INTRAMUSCULAR | Status: DC | PRN
  Administered 2015-06-21: 1 mg via INTRAVENOUS

## 2015-06-21 MED ORDER — OXYCODONE-ACETAMINOPHEN 5-325 MG PO TABS: 1.0000 | ORAL_TABLET | ORAL | Status: AC | PRN

## 2015-06-21 MED ORDER — DEXAMETHASONE SODIUM PHOSPHATE 10 MG/ML IJ SOLN
INTRAMUSCULAR | Status: DC | PRN
  Administered 2015-06-21: 5 mg via INTRAVENOUS

## 2015-06-21 MED ORDER — LACTATED RINGERS IV SOLN
INTRAVENOUS | Status: DC | PRN
  Administered 2015-06-21: 07:00:00 via INTRAVENOUS

## 2015-06-21 MED ORDER — FENTANYL CITRATE (PF) 100 MCG/2ML IJ SOLN
25.0000 ug | INTRAMUSCULAR | Status: DC | PRN
  Administered 2015-06-21: 100 ug via INTRAVENOUS
  Administered 2015-06-21: 25 ug via INTRAVENOUS

## 2015-06-21 MED ORDER — GLYCOPYRROLATE 0.2 MG/ML IV SOSY
PREFILLED_SYRINGE | INTRAVENOUS | Status: AC
  Filled 2015-06-21: qty 3

## 2015-06-21 MED ORDER — ACETAMINOPHEN 10 MG/ML IV SOLN
INTRAVENOUS | Status: AC
  Administered 2015-06-21: 1000 mg via INTRAVENOUS
  Filled 2015-06-21: qty 100

## 2015-06-21 MED ORDER — DEXAMETHASONE SODIUM PHOSPHATE 10 MG/ML IJ SOLN
INTRAMUSCULAR | Status: AC
  Filled 2015-06-21: qty 1

## 2015-06-21 MED ORDER — MORPHINE SULFATE (PF) 2 MG/ML IV SOLN
1.0000 mg | INTRAVENOUS | Status: DC | PRN
  Administered 2015-06-21: 2 mg via INTRAVENOUS

## 2015-06-21 MED ORDER — PROPOFOL 10 MG/ML IV BOLUS
INTRAVENOUS | Status: AC
  Filled 2015-06-21: qty 40

## 2015-06-21 MED ORDER — ARTIFICIAL TEARS OP OINT
TOPICAL_OINTMENT | OPHTHALMIC | Status: AC
  Filled 2015-06-21: qty 3.5

## 2015-06-21 MED ORDER — FENTANYL CITRATE (PF) 100 MCG/2ML IJ SOLN
INTRAMUSCULAR | Status: AC
  Filled 2015-06-21: qty 2

## 2015-06-21 MED ORDER — OXYCODONE HCL 5 MG PO TABS
ORAL_TABLET | ORAL | Status: AC
  Filled 2015-06-21: qty 2

## 2015-06-21 MED ORDER — PROCHLORPERAZINE EDISYLATE 5 MG/ML IJ SOLN
INTRAMUSCULAR | Status: AC
  Filled 2015-06-21: qty 2

## 2015-06-21 MED ORDER — FENTANYL CITRATE (PF) 100 MCG/2ML IJ SOLN
INTRAMUSCULAR | Status: DC | PRN
  Administered 2015-06-21 (×3): 50 ug via INTRAVENOUS
  Administered 2015-06-21: 100 ug via INTRAVENOUS

## 2015-06-21 MED ORDER — LIDOCAINE 2% (20 MG/ML) 5 ML SYRINGE
INTRAMUSCULAR | Status: AC
  Filled 2015-06-21: qty 5

## 2015-06-21 MED ORDER — ONDANSETRON HCL 4 MG/2ML IJ SOLN
INTRAMUSCULAR | Status: DC | PRN
  Administered 2015-06-21: 4 mg via INTRAVENOUS

## 2015-06-21 MED ORDER — IOPAMIDOL (ISOVUE-300) INJECTION 61%
INTRAVENOUS | Status: AC
  Filled 2015-06-21: qty 50

## 2015-06-21 MED ORDER — PROMETHAZINE HCL 25 MG/ML IJ SOLN
INTRAMUSCULAR | Status: AC
  Filled 2015-06-21: qty 1

## 2015-06-21 MED ORDER — ROCURONIUM BROMIDE 100 MG/10ML IV SOLN
INTRAVENOUS | Status: DC | PRN
  Administered 2015-06-21: 50 mg via INTRAVENOUS

## 2015-06-21 MED ORDER — MORPHINE SULFATE (PF) 4 MG/ML IV SOLN
INTRAVENOUS | Status: AC
  Filled 2015-06-21: qty 1

## 2015-06-21 MED ORDER — SODIUM CHLORIDE 0.9 % IR SOLN
Status: DC | PRN
  Administered 2015-06-21: 1000 mL

## 2015-06-21 MED ORDER — HYDROMORPHONE HCL 1 MG/ML IJ SOLN
INTRAMUSCULAR | Status: AC
  Filled 2015-06-21: qty 1

## 2015-06-21 MED ORDER — BUPIVACAINE-EPINEPHRINE (PF) 0.25% -1:200000 IJ SOLN
INTRAMUSCULAR | Status: AC
  Filled 2015-06-21: qty 30

## 2015-06-21 MED ORDER — LIDOCAINE HCL (CARDIAC) 20 MG/ML IV SOLN
INTRAVENOUS | Status: DC | PRN
  Administered 2015-06-21: 60 mg via INTRAVENOUS

## 2015-06-21 MED ORDER — ACETAMINOPHEN 325 MG PO TABS: 650.0000 mg | ORAL_TABLET | ORAL | Status: DC | PRN

## 2015-06-21 MED ORDER — MIDAZOLAM HCL 5 MG/5ML IJ SOLN
INTRAMUSCULAR | Status: DC | PRN
  Administered 2015-06-21: 2 mg via INTRAVENOUS

## 2015-06-21 MED ORDER — KETOROLAC TROMETHAMINE 30 MG/ML IJ SOLN
INTRAMUSCULAR | Status: DC | PRN
Start: 2015-06-21 — End: 2015-06-21
  Administered 2015-06-21: 30 mg via INTRAVENOUS

## 2015-06-21 MED ORDER — 0.9 % SODIUM CHLORIDE (POUR BTL) OPTIME
TOPICAL | Status: DC | PRN
  Administered 2015-06-21: 1000 mL

## 2015-06-21 MED ORDER — HYDROMORPHONE HCL 1 MG/ML IJ SOLN
INTRAMUSCULAR | Status: DC | PRN
  Administered 2015-06-21 (×2): 0.5 mg via INTRAVENOUS

## 2015-06-21 MED ORDER — OXYCODONE HCL 5 MG PO TABS
5.0000 mg | ORAL_TABLET | ORAL | Status: DC | PRN
  Administered 2015-06-21: 10 mg via ORAL

## 2015-06-21 MED ORDER — SUCCINYLCHOLINE CHLORIDE 200 MG/10ML IV SOSY
PREFILLED_SYRINGE | INTRAVENOUS | Status: AC
  Filled 2015-06-21: qty 10

## 2015-06-21 MED ORDER — ONDANSETRON HCL 4 MG PO TABS: 4.0000 mg | ORAL_TABLET | Freq: Three times a day (TID) | ORAL | Status: AC | PRN

## 2015-06-21 MED ORDER — ACETAMINOPHEN 650 MG RE SUPP: 650.0000 mg | RECTAL | Status: DC | PRN

## 2015-06-21 MED ORDER — PROPOFOL 10 MG/ML IV BOLUS
INTRAVENOUS | Status: DC | PRN
Start: 2015-06-21 — End: 2015-06-21
  Administered 2015-06-21: 200 mg via INTRAVENOUS

## 2015-06-21 MED ORDER — ONDANSETRON HCL 4 MG/2ML IJ SOLN
INTRAMUSCULAR | Status: AC
  Filled 2015-06-21: qty 2

## 2015-06-21 MED ORDER — ROCURONIUM BROMIDE 50 MG/5ML IV SOLN
INTRAVENOUS | Status: AC
  Filled 2015-06-21: qty 1

## 2015-06-21 MED ORDER — MIDAZOLAM HCL 2 MG/2ML IJ SOLN
INTRAMUSCULAR | Status: AC
  Filled 2015-06-21: qty 2

## 2015-06-21 SURGICAL SUPPLY — 43 items
APPLIER CLIP 5 13 M/L LIGAMAX5 (MISCELLANEOUS) ×3
APR CLP MED LRG 5 ANG JAW (MISCELLANEOUS) ×1
BAG SPEC RTRVL LRG 6X4 10 (ENDOMECHANICALS) ×1
CANISTER SUCTION 2500CC (MISCELLANEOUS) ×3 IMPLANT
CHLORAPREP W/TINT 26ML (MISCELLANEOUS) ×3 IMPLANT
CLIP APPLIE 5 13 M/L LIGAMAX5 (MISCELLANEOUS) ×1 IMPLANT
COVER SURGICAL LIGHT HANDLE (MISCELLANEOUS) ×3 IMPLANT
ELECT REM PT RETURN 9FT ADLT (ELECTROSURGICAL) ×3
ELECTRODE REM PT RTRN 9FT ADLT (ELECTROSURGICAL) ×1 IMPLANT
GLOVE BIO SURGEON STRL SZ7 (GLOVE) ×2 IMPLANT
GLOVE BIO SURGEON STRL SZ8 (GLOVE) ×2 IMPLANT
GLOVE BIOGEL PI IND STRL 6.5 (GLOVE) IMPLANT
GLOVE BIOGEL PI IND STRL 7.0 (GLOVE) IMPLANT
GLOVE BIOGEL PI IND STRL 8.5 (GLOVE) IMPLANT
GLOVE BIOGEL PI INDICATOR 6.5 (GLOVE) ×2
GLOVE BIOGEL PI INDICATOR 7.0 (GLOVE) ×2
GLOVE BIOGEL PI INDICATOR 8.5 (GLOVE) ×2
GLOVE ECLIPSE 6.5 STRL STRAW (GLOVE) ×2 IMPLANT
GLOVE ECLIPSE 7.0 STRL STRAW (GLOVE) ×2 IMPLANT
GLOVE SURG SIGNA 7.5 PF LTX (GLOVE) ×3 IMPLANT
GLOVE SURG SS PI 6.5 STRL IVOR (GLOVE) ×2 IMPLANT
GOWN STRL REUS W/ TWL LRG LVL3 (GOWN DISPOSABLE) ×2 IMPLANT
GOWN STRL REUS W/ TWL XL LVL3 (GOWN DISPOSABLE) ×1 IMPLANT
GOWN STRL REUS W/TWL LRG LVL3 (GOWN DISPOSABLE) ×6
GOWN STRL REUS W/TWL XL LVL3 (GOWN DISPOSABLE) ×6
KIT BASIN OR (CUSTOM PROCEDURE TRAY) ×3 IMPLANT
KIT ROOM TURNOVER OR (KITS) ×3 IMPLANT
LIQUID BAND (GAUZE/BANDAGES/DRESSINGS) ×3 IMPLANT
NS IRRIG 1000ML POUR BTL (IV SOLUTION) ×3 IMPLANT
PAD ARMBOARD 7.5X6 YLW CONV (MISCELLANEOUS) ×3 IMPLANT
POUCH SPECIMEN RETRIEVAL 10MM (ENDOMECHANICALS) ×3 IMPLANT
SCISSORS LAP 5X35 DISP (ENDOMECHANICALS) ×3 IMPLANT
SET IRRIG TUBING LAPAROSCOPIC (IRRIGATION / IRRIGATOR) ×3 IMPLANT
SLEEVE ENDOPATH XCEL 5M (ENDOMECHANICALS) ×6 IMPLANT
SPECIMEN JAR SMALL (MISCELLANEOUS) ×3 IMPLANT
SUT MON AB 4-0 PC3 18 (SUTURE) ×3 IMPLANT
SUT VICRYL 0 UR6 27IN ABS (SUTURE) ×2 IMPLANT
TOWEL OR 17X24 6PK STRL BLUE (TOWEL DISPOSABLE) ×3 IMPLANT
TOWEL OR 17X26 10 PK STRL BLUE (TOWEL DISPOSABLE) ×3 IMPLANT
TRAY LAPAROSCOPIC MC (CUSTOM PROCEDURE TRAY) ×3 IMPLANT
TROCAR XCEL BLUNT TIP 100MML (ENDOMECHANICALS) ×3 IMPLANT
TROCAR XCEL NON-BLD 5MMX100MML (ENDOMECHANICALS) ×3 IMPLANT
TUBING INSUFFLATION (TUBING) ×3 IMPLANT

## 2015-06-21 NOTE — Op Note (Signed)
Laparoscopic Cholecystectomy Procedure Note  Indications: This patient presents with symptomatic gallbladder disease and will undergo laparoscopic cholecystectomy.  Pre-operative Diagnosis: Calculus of gallbladder without mention of cholecystitis or obstruction  Post-operative Diagnosis: Same  Surgeon: Abigail MiyamotoBLACKMAN,Kellan Boehlke A   Assistants: 0  Anesthesia: General endotracheal anesthesia  ASA Class: 2  Procedure Details  The patient was seen again in the Holding Room. The risks, benefits, complications, treatment options, and expected outcomes were discussed with the patient. The possibilities of reaction to medication, pulmonary aspiration, perforation of viscus, bleeding, recurrent infection, finding a normal gallbladder, the need for additional procedures, failure to diagnose a condition, the possible need to convert to an open procedure, and creating a complication requiring transfusion or operation were discussed with the patient. The likelihood of improving the patient's symptoms with return to their baseline status is good.  The patient and/or family concurred with the proposed plan, giving informed consent. The site of surgery properly noted. The patient was taken to Operating Room, identified as Nancy Hayden and the procedure verified as Laparoscopic Cholecystectomy with Intraoperative Cholangiogram. A Time Out was held and the above information confirmed.  Prior to the induction of general anesthesia, antibiotic prophylaxis was administered. General endotracheal anesthesia was then administered and tolerated well. After the induction, the abdomen was prepped with Chloraprep and draped in sterile fashion. The patient was positioned in the supine position.  Local anesthetic agent was injected into the skin near the umbilicus and an incision made. We dissected down to the abdominal fascia with blunt dissection.  The fascia was incised vertically and we entered the peritoneal cavity bluntly.   A pursestring suture of 0-Vicryl was placed around the fascial opening.  The Hasson cannula was inserted and secured with the stay suture.  Pneumoperitoneum was then created with CO2 and tolerated well without any adverse changes in the patient's vital signs. A 5-mm port was placed in the subxiphoid position.  Two 5-mm ports were placed in the right upper quadrant. All skin incisions were infiltrated with a local anesthetic agent before making the incision and placing the trocars.   We positioned the patient in reverse Trendelenburg, tilted slightly to the patient's left.  The gallbladder was identified, the fundus grasped and retracted cephalad. Adhesions were lysed bluntly and with the electrocautery where indicated, taking care not to injure any adjacent organs or viscus. The infundibulum was grasped and retracted laterally, exposing the peritoneum overlying the triangle of Calot. This was then divided and exposed in a blunt fashion. The cystic duct was clearly identified and bluntly dissected circumferentially. A critical view of the cystic duct and cystic artery was obtained.  The cystic duct was then ligated with clips and divided. The cystic artery was, dissected free, ligated with clips and divided as well.   The gallbladder was dissected from the liver bed in retrograde fashion with the electrocautery. The gallbladder was removed and placed in an Endocatch sac. The liver bed was irrigated and inspected. Hemostasis was achieved with the electrocautery. Copious irrigation was utilized and was repeatedly aspirated until clear.  The gallbladder and Endocatch sac were then removed through the umbilical port site.  The stones were so large, I had to open the fascia and skin further.  The pursestring suture was used to close the umbilical fascia.  I placed a second similar vicryl suture as well.  We again inspected the right upper quadrant for hemostasis.  Pneumoperitoneum was released as we removed the  trocars.  4-0 Monocryl was used to  close the skin.   Skin glue was then applied. The patient was then extubated and brought to the recovery room in stable condition. Instrument, sponge, and needle counts were correct at closure and at the conclusion of the case.   Findings: Chronic Cholecystitis with Cholelithiasis  Estimated Blood Loss: Minimal         Drains: 0         Specimens: Gallbladder           Complications: None; patient tolerated the procedure well.         Disposition: PACU - hemodynamically stable.         Condition: stable

## 2015-06-21 NOTE — Anesthesia Procedure Notes (Signed)
Procedure Name: Intubation Date/Time: 06/21/2015 7:39 AM Performed by: Margaree MackintoshYACOUB, Lysbeth Dicola B Pre-anesthesia Checklist: Patient identified, Emergency Drugs available, Suction available, Patient being monitored and Timeout performed Patient Re-evaluated:Patient Re-evaluated prior to inductionOxygen Delivery Method: Circle system utilized Preoxygenation: Pre-oxygenation with 100% oxygen Intubation Type: IV induction and Rapid sequence Laryngoscope Size: Mac and 4 Grade View: Grade I Tube type: Oral Tube size: 7.0 mm Number of attempts: 1 Airway Equipment and Method: Stylet Placement Confirmation: ETT inserted through vocal cords under direct vision,  positive ETCO2 and breath sounds checked- equal and bilateral Secured at: 22 cm Tube secured with: Tape Dental Injury: Teeth and Oropharynx as per pre-operative assessment

## 2015-06-21 NOTE — Interval H&P Note (Signed)
History and Physical Interval Note: no change in H and P  06/21/2015 6:53 AM  Nancy Hayden  has presented today for surgery, with the diagnosis of gallstones  The various methods of treatment have been discussed with the patient and family. After consideration of risks, benefits and other options for treatment, the patient has consented to  Procedure(s): LAPAROSCOPIC CHOLECYSTECTOMY (N/A) as a surgical intervention .  The patient's history has been reviewed, patient examined, no change in status, stable for surgery.  I have reviewed the patient's chart and labs.  Questions were answered to the patient's satisfaction.     Chelesea Weiand A

## 2015-06-21 NOTE — Anesthesia Postprocedure Evaluation (Signed)
Anesthesia Post Note  Patient: Nancy Hayden  Procedure(s) Performed: Procedure(s) (LRB): LAPAROSCOPIC CHOLECYSTECTOMY (N/A)  Patient location during evaluation: PACU Anesthesia Type: General Level of consciousness: awake and alert Pain management: pain level controlled Vital Signs Assessment: post-procedure vital signs reviewed and stable Respiratory status: spontaneous breathing, nonlabored ventilation, respiratory function stable and patient connected to nasal cannula oxygen Cardiovascular status: blood pressure returned to baseline and stable Postop Assessment: no signs of nausea or vomiting Anesthetic complications: no    Last Vitals:  Filed Vitals:   06/21/15 0945 06/21/15 1025  BP:  144/82  Pulse:  105  Temp: 36.7 C   Resp:  22    Last Pain:  Filed Vitals:   06/21/15 1029  PainSc: Asleep                 Cecile HearingStephen Edward Turk

## 2015-06-21 NOTE — Discharge Instructions (Signed)
[facility] ______[facility] LAPAROSCOPIC SURGERY: POST OP INSTRUCTIONS Always review your discharge instruction sheet given to you by the facility where your surgery was performed. IF YOU HAVE DISABILITY OR FAMILY LEAVE FORMS, YOU MUST BRING THEM TO THE OFFICE FOR PROCESSING.   DO NOT GIVE THEM TO YOUR DOCTOR.  1. A prescription for pain medication may be given to you upon discharge.  Take your pain medication as prescribed, if needed.  If narcotic pain medicine is not needed, then you may take acetaminophen (Tylenol) or ibuprofen (Advil) as needed. 2. Take your usually prescribed medications unless otherwise directed. 3. If you need a refill on your pain medication, please contact your pharmacy.  They will contact our office to request authorization. Prescriptions will not be filled after 5pm or on week-ends. 4. You should follow a light diet the first few days after arrival home, such as soup and crackers, etc.  Be sure to include lots of fluids daily. 5. Most patients will experience some swelling and bruising in the area of the incisions.  Ice packs will help.  Swelling and bruising can take several days to resolve.  6. It is common to experience some constipation if taking pain medication after surgery.  Increasing fluid intake and taking a stool softener (such as Colace) will usually help or prevent this problem from occurring.  A mild laxative (Milk of Magnesia or Miralax) should be taken according to package instructions if there are no bowel movements after 48 hours. 7. Unless discharge instructions indicate otherwise, you may remove your bandages 24-48 hours after surgery, and you may shower at that time.  You may have steri-strips (small skin tapes) in place directly over the incision.  These strips should be left on the skin for 7-10 days.  If your surgeon used skin glue on the incision, you may shower in 24 hours.  The glue will flake off over the next 2-3 weeks.  Any sutures or  staples will be removed at the office during your follow-up visit. 8. ACTIVITIES:  You may resume regular (light) daily activities beginning the next day--such as daily self-care, walking, climbing stairs--gradually increasing activities as tolerated.  You may have sexual intercourse when it is comfortable.  Refrain from any heavy lifting or straining until approved by your doctor. a. You may drive when you are no longer taking prescription pain medication, you can comfortably wear a seatbelt, and you can safely maneuver your car and apply brakes. b. RETURN TO WORK:  __________________________________________________________ 9. You should see your doctor in the office for a follow-up appointment approximately 2-3 weeks after your surgery.  Make sure that you call for this appointment within a day or two after you arrive home to insure a convenient appointment time. 10. OTHER INSTRUCTIONS:NO LIFTING MORE THAN 15 POUNDS FOR 2 TO 3 WEEKS __________________________________________________________________________________________________________________________ __________________________________________________________________________________________________________________________ WHEN TO CALL YOUR DOCTOR: 1. Fever over 101.0 2. Inability to urinate 3. Continued bleeding from incision. 4. Increased pain, redness, or drainage from the incision. 5. Increasing abdominal pain  The clinic staff is available to answer your questions during regular business hours.  Please dont hesitate to call and ask to speak to one of the nurses for clinical concerns.  If you have a medical emergency, go to the nearest emergency room or call 911.  A [facility] from [facility] is always on call at the hospital. [address], [address], [place], [place]  [id number] ? [address] [address], [place], [place]   [id number] (  [phone] ? [phone] ? FAX [phone] Web site: [email or web address]

## 2015-06-21 NOTE — Transfer of Care (Signed)
Immediate Anesthesia Transfer of Care Note  Patient: Vallarie MareHannah E River  Procedure(s) Performed: Procedure(s): LAPAROSCOPIC CHOLECYSTECTOMY (N/A)  Patient Location: PACU  Anesthesia Type:General  Level of Consciousness: awake, alert  and oriented  Airway & Oxygen Therapy: Patient Spontanous Breathing and Patient connected to nasal cannula oxygen  Post-op Assessment: Report given to RN and Post -op Vital signs reviewed and stable  Post vital signs: Reviewed and stable  Last Vitals:  Filed Vitals:   06/21/15 0556 06/21/15 0838  BP: 115/50   Pulse: 67 68  Temp: 36.8 C 36.3 C  Resp: 20 16    Last Pain:  Filed Vitals:   06/21/15 0842  PainSc: 0-No pain         Complications: No apparent anesthesia complications

## 2015-06-21 NOTE — OR Nursing (Signed)
Educated patient about surgical risks related to not removing inner lip ring and left earring.  Patient verbalized understanding.  Tape was applied around left earring for preventative measures.

## 2015-06-25 ENCOUNTER — Encounter (HOSPITAL_COMMUNITY): Payer: Self-pay | Admitting: Surgery

## 2015-07-09 DIAGNOSIS — E669 Obesity, unspecified: Secondary | ICD-10-CM | POA: Diagnosis not present

## 2015-07-09 DIAGNOSIS — Z6841 Body Mass Index (BMI) 40.0 and over, adult: Secondary | ICD-10-CM | POA: Diagnosis not present

## 2016-07-20 IMAGING — US US ABDOMEN LIMITED
1 series · 14 of 25 positions shown · non-contrast
Comparison: None.

CLINICAL DATA: Right upper quadrant pain, nausea/vomiting

EXAM:
US ABDOMEN LIMITED - RIGHT UPPER QUADRANT

[Series 1: us abdomen limited · 0.28mm/px · 14 of 54 slices shown]
[im 1/54]
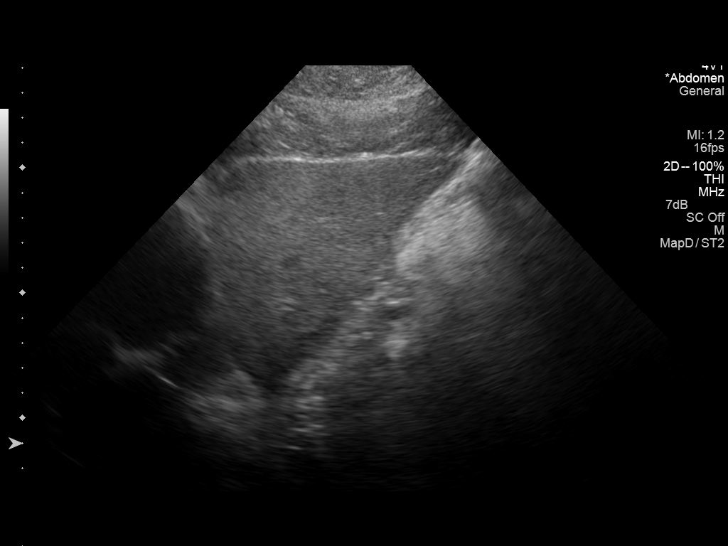
[im 5/54]
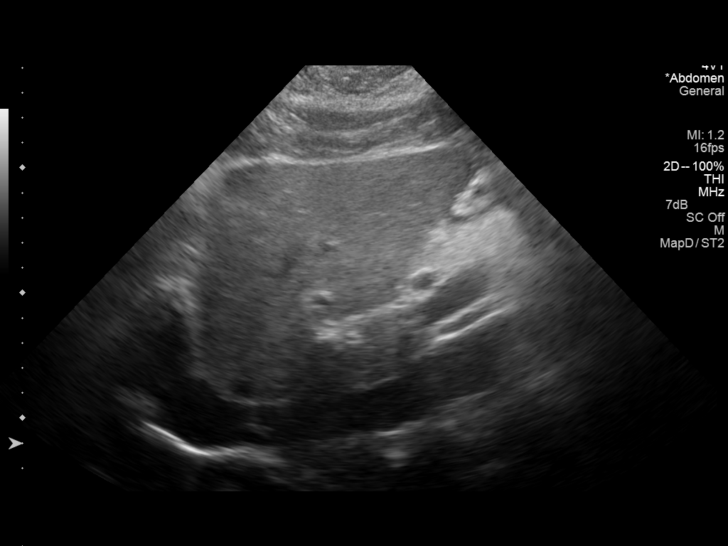
[im 9/54]
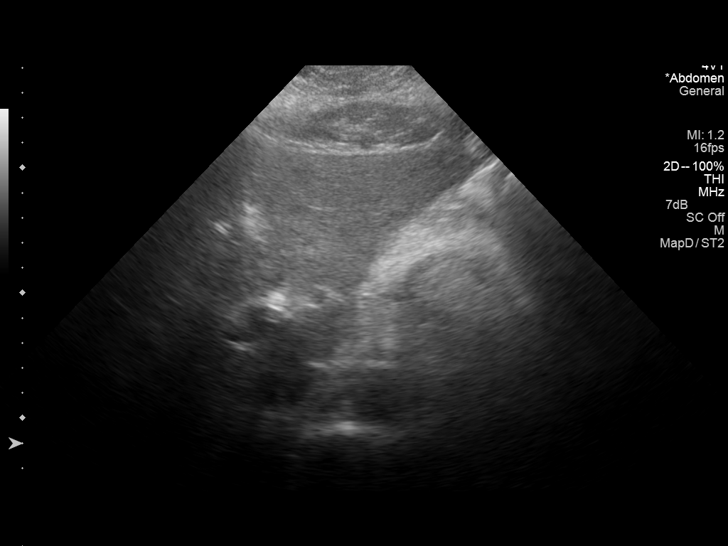
[im 14/54]
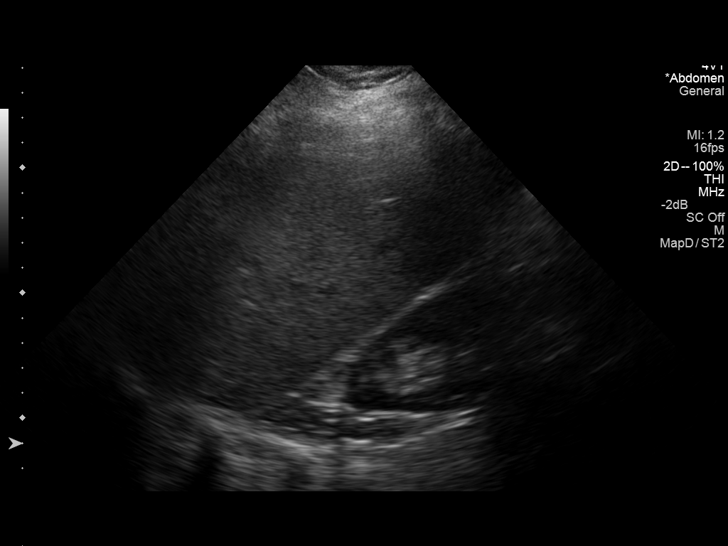
[im 18/54]
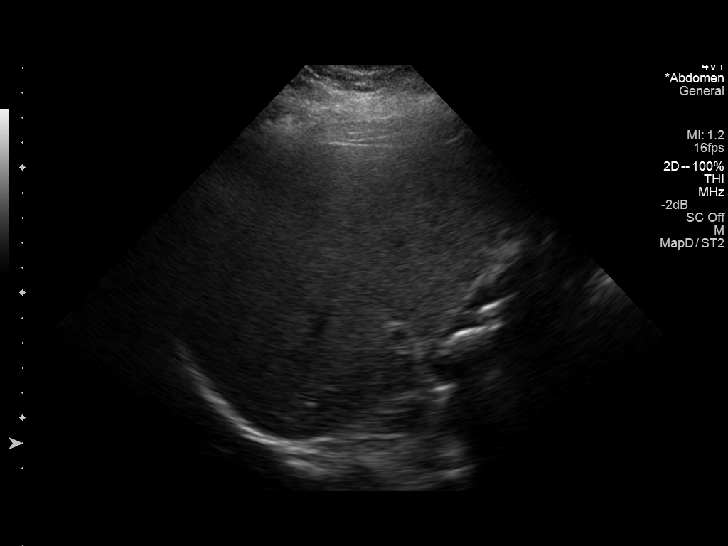
[im 20/54]
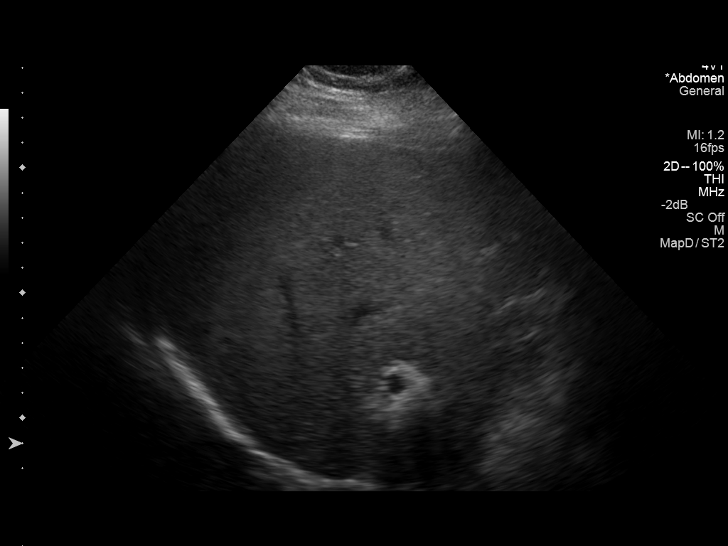
[im 25/54]
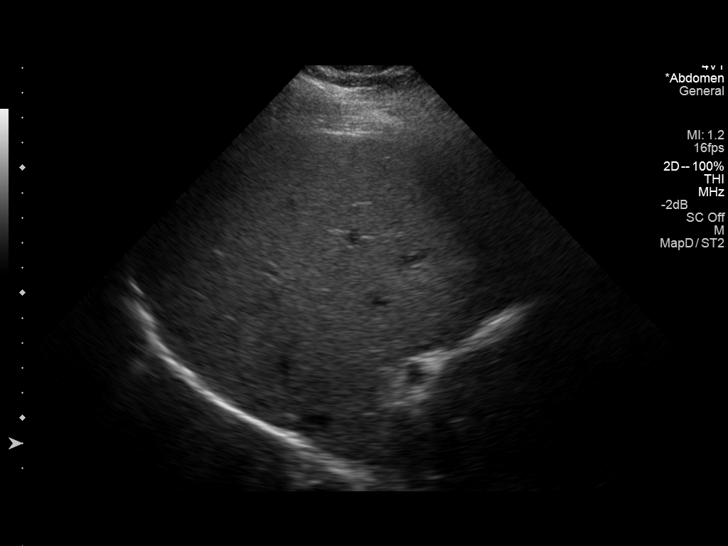
[im 29/54]
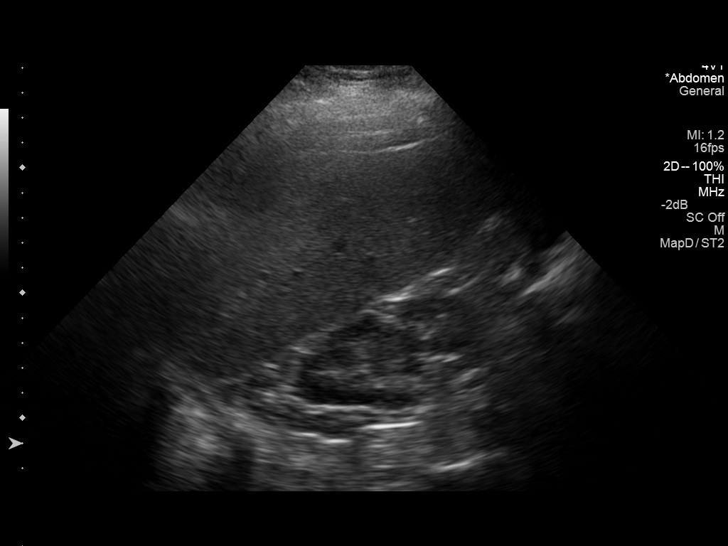
[im 34/54]
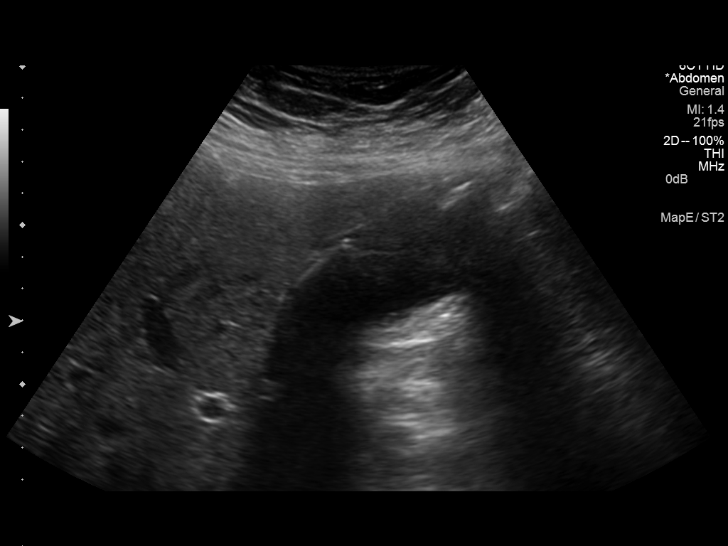
[im 36/54]
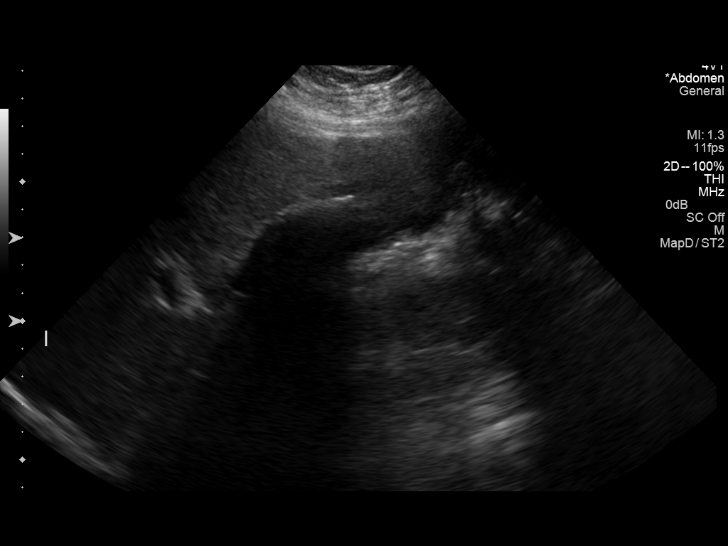
[im 40/54]
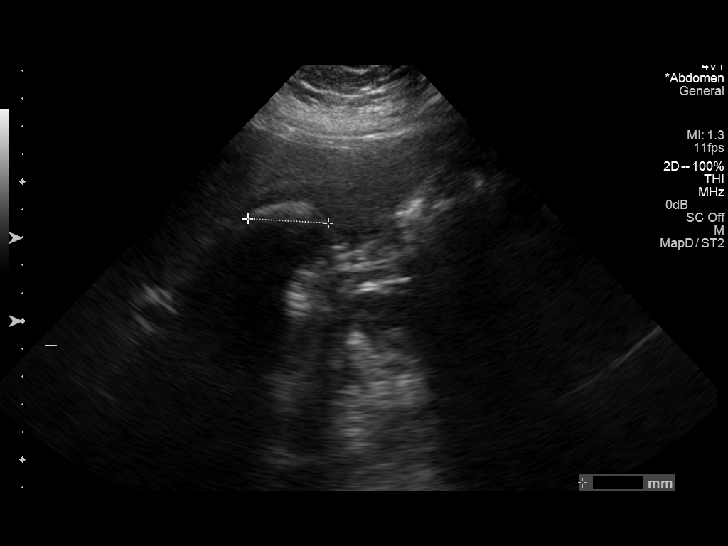
[im 45/54]
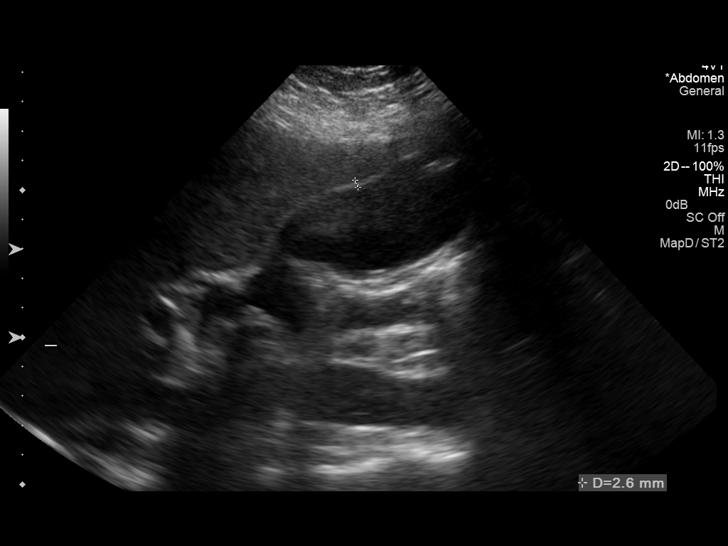
[im 49/54]
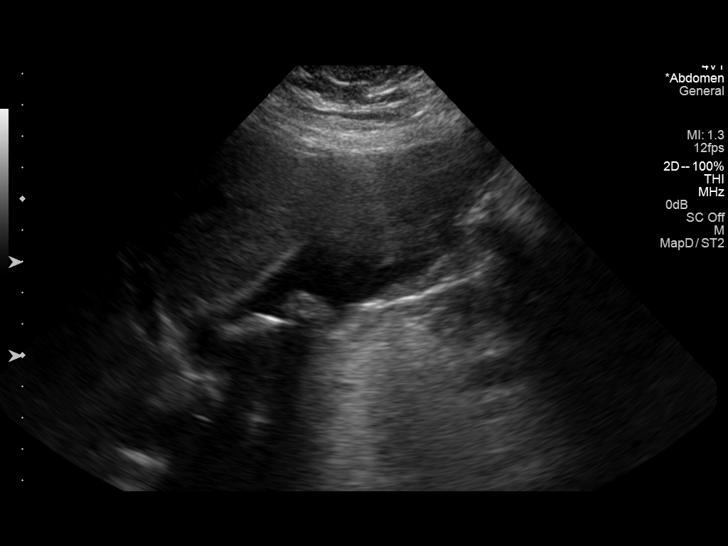
[im 54/54]
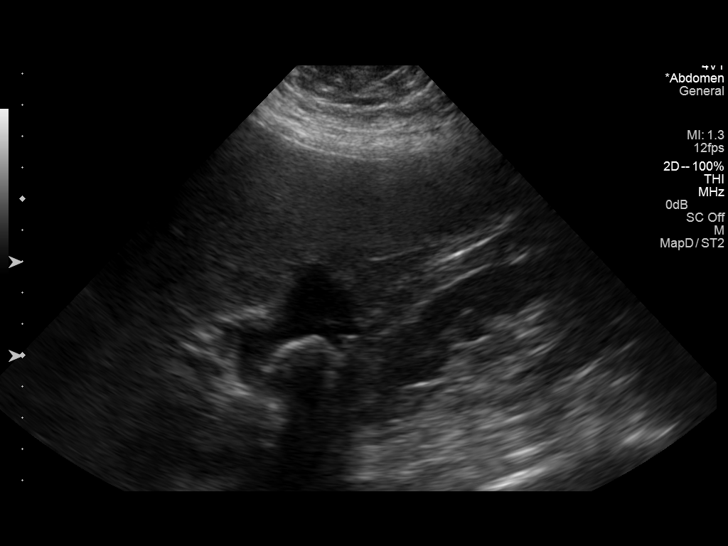

[14 of 25 positions shown; findings below may reference images not displayed]

FINDINGS: Gallbladder:

Multiple gallstones measuring up to 2.8 cm. No gallbladder wall
thickening or pericholecystic fluid. Negative sonographic Murphy's
sign.

Common bile duct:

Diameter: 4 mm

Liver:

No focal lesion identified. Within normal limits in parenchymal
echogenicity. At the upper limits of normal for size.
IMPRESSION: Cholelithiasis, without associated sonographic findings to suggest
acute cholecystitis.

## 2017-03-17 ENCOUNTER — Telehealth: Payer: 59 | Admitting: Family

## 2017-03-17 DIAGNOSIS — J029 Acute pharyngitis, unspecified: Secondary | ICD-10-CM

## 2017-03-17 MED ORDER — PREDNISONE 5 MG PO TABS: 5.0000 mg | ORAL_TABLET | ORAL | 0 refills | Status: DC

## 2017-03-17 MED ORDER — BENZONATATE 100 MG PO CAPS: 100.0000 mg | ORAL_CAPSULE | Freq: Three times a day (TID) | ORAL | 0 refills | Status: DC | PRN

## 2017-03-17 MED ORDER — ALBUTEROL SULFATE HFA 108 (90 BASE) MCG/ACT IN AERS: 2.0000 | INHALATION_SPRAY | Freq: Four times a day (QID) | RESPIRATORY_TRACT | 2 refills | Status: DC | PRN

## 2017-03-17 NOTE — Progress Notes (Signed)
Thank you for the details you included in the comment boxes. Those details are very helpful in determining the best course of treatment for you and help us to provide the best care. The current standard of care is 5-7 days of illness before antibiotics are given. I wanted you to know this as it is the evidence-based guideline the E-Visit program operates by. However, approximately 89% of these infections are viral and they do respond very well to supportive care and measures to decrease inflammation, which we will provide for you below.   We are sorry that you are not feeling well.  Here is how we plan to help!  Based on your presentation I believe you most likely have A cough and sinusitis due to a virus.  This is called viral bronchitis and is best treated by rest, plenty of fluids and control of the cough.  You may use Ibuprofen or Tylenol as directed to help your symptoms.     In addition you may use A non-prescription cough medication called Mucinex DM: take 2 tablets every 12 hours. and A prescription cough medication called Tessalon Perles 100mg . You may take 1-2 capsules every 8 hours as needed for your cough.  Sterapred 5 mg dosepak  From your responses in the eVisit questionnaire you describe inflammation in the upper respiratory tract which is causing a significant cough.  This is commonly called Bronchitis and has four common causes:    Allergies  Viral Infections  Acid Reflux  Bacterial Infection Allergies, viruses and acid reflux are treated by controlling symptoms or eliminating the cause. An example might be a cough caused by taking certain blood pressure medications. You stop the cough by changing the medication. Another example might be a cough caused by acid reflux. Controlling the reflux helps control the cough.  USE OF BRONCHODILATOR ("RESCUE") INHALERS: There is a risk from using your bronchodilator too frequently.  The risk is that over-reliance on a medication which only  relaxes the muscles surrounding the breathing tubes can reduce the effectiveness of medications prescribed to reduce swelling and congestion of the tubes themselves.  Although you feel brief relief from the bronchodilator inhaler, your asthma may actually be worsening with the tubes becoming more swollen and filled with mucus.  This can delay other crucial treatments, such as oral steroid medications. If you need to use a bronchodilator inhaler daily, several times per day, you should discuss this with your provider.  There are probably better treatments that could be used to keep your asthma under control.     HOME CARE . Only take medications as instructed by your medical team. . Complete the entire course of an antibiotic. . Drink plenty of fluids and get plenty of rest. . Avoid close contacts especially the very young and the elderly . Cover your mouth if you cough or cough into your sleeve. . Always remember to wash your hands . A steam or ultrasonic humidifier can help congestion.   GET HELP RIGHT AWAY IF: . You develop worsening fever. . You become short of breath . You cough up blood. . Your symptoms persist after you have completed your treatment plan MAKE SURE YOU   Understand these instructions.  Will watch your condition.  Will get help right away if you are not doing well or get worse.  Your e-visit answers were reviewed by a board certified advanced clinical practitioner to complete your personal care plan.  Depending on the condition, your plan could have included  both over the counter or prescription medications. If there is a problem please reply  once you have received a response from your provider. Your safety is important to [place].  If you have drug allergies check your prescription carefully.    You can use MyChart to ask questions about today's visit, request a non-urgent call back, or ask for a work or school excuse for 24 hours related to this e-Visit. If it has been  greater than 24 hours you will need to follow up with your provider, or enter a new e-Visit to address those concerns. You will get an e-mail in the next two days asking about your experience.  I hope that your e-visit has been valuable and will speed your recovery. Thank you for using e-visits.

## 2017-04-22 ENCOUNTER — Telehealth: Payer: 59 | Admitting: Family

## 2017-04-22 DIAGNOSIS — J329 Chronic sinusitis, unspecified: Secondary | ICD-10-CM | POA: Diagnosis not present

## 2017-04-22 DIAGNOSIS — B9689 Other specified bacterial agents as the cause of diseases classified elsewhere: Secondary | ICD-10-CM | POA: Diagnosis not present

## 2017-04-22 MED ORDER — AMOXICILLIN-POT CLAVULANATE 875-125 MG PO TABS: 1.0000 | ORAL_TABLET | Freq: Two times a day (BID) | ORAL | 0 refills | Status: AC

## 2017-04-22 NOTE — Progress Notes (Signed)

## 2017-09-24 DIAGNOSIS — Z6841 Body Mass Index (BMI) 40.0 and over, adult: Secondary | ICD-10-CM | POA: Diagnosis not present

## 2017-09-24 DIAGNOSIS — Z01419 Encounter for gynecological examination (general) (routine) without abnormal findings: Secondary | ICD-10-CM | POA: Diagnosis not present

## 2018-12-14 ENCOUNTER — Telehealth: Payer: 59 | Admitting: Nurse Practitioner

## 2018-12-14 DIAGNOSIS — R05 Cough: Secondary | ICD-10-CM

## 2018-12-14 DIAGNOSIS — R059 Cough, unspecified: Secondary | ICD-10-CM

## 2018-12-14 DIAGNOSIS — Z20822 Contact with and (suspected) exposure to covid-19: Secondary | ICD-10-CM

## 2018-12-14 DIAGNOSIS — R0602 Shortness of breath: Secondary | ICD-10-CM

## 2018-12-14 DIAGNOSIS — R519 Headache, unspecified: Secondary | ICD-10-CM

## 2018-12-14 DIAGNOSIS — R52 Pain, unspecified: Secondary | ICD-10-CM

## 2018-12-14 DIAGNOSIS — Z20828 Contact with and (suspected) exposure to other viral communicable diseases: Secondary | ICD-10-CM

## 2018-12-14 MED ORDER — BENZONATATE 100 MG PO CAPS: 100.0000 mg | ORAL_CAPSULE | Freq: Three times a day (TID) | ORAL | 0 refills | Status: DC | PRN

## 2018-12-14 NOTE — Progress Notes (Signed)
E-Visit for Corona Virus Screening   Your current symptoms could be consistent with the coronavirus.  Many health care providers can now test patients at their office but not all are.  [facility] has multiple testing sites. For information on our COVID testing locations and hours go to [email or web address]  Please quarantine yourself while awaiting your test results.  We are enrolling you in our MyChart Home Montioring for COVID19 . Daily you will receive a questionnaire within the MyChart website. Our COVID 19 response team willl be monitoriing your responses daily.  You can go to one of the  testing sites listed below, while they are opened (see hours). You do not need a doctors order to be tested for covid.You do need to self-isolate until your results return and if positive 14 days from when your symptoms started and until you are 3 days symptom free.   Testing Locations (Monday - Friday, 10a.m. - 3:30 p.m.)   [place]: [facility] (Visitor Entrance), [address], [place], [place] -   [place]: [facility], [address], [place], [place] (entrance off [address]) -   [place] (Closed each Monday): [address], [place], [place] - the short stay covered drive at [facility] (Use the [address] entrance to [facility] next to [facility].) -     COVID-19 is a respiratory illness with symptoms that are similar to the flu. Symptoms are typically mild to moderate, but there have been cases of severe illness and death due to the virus. The following symptoms may appear 2-14 days after exposure: . Fever . Cough . Shortness of breath or difficulty breathing . Chills . Repeated shaking with chills . Muscle pain . Headache . Sore throat . New loss of taste or smell . Fatigue . Congestion or runny nose . Nausea or vomiting . Diarrhea  It is vitally  important that if you feel that you have an infection such as this virus or any other virus that you stay home and away from places where you may spread it to others.  You should self-quarantine for 14 days if you have symptoms that could potentially be coronavirus or have been in close contact a with a person diagnosed with COVID-19 within the last 2 weeks. You should avoid contact with people age 65 and older.   You should wear a mask or cloth face covering over your nose and mouth if you must be around other people or animals, including pets (even at home). Try to stay at least 6 feet away from other people. This will protect the people around you.  You can use medication such as A prescription cough medication called Tessalon Perles 100 mg. You may take 1-2 capsules every 8 hours as needed for cough  You may also take acetaminophen (Tylenol) as needed for fever.   Reduce your risk of any infection by using the same precautions used for avoiding the common cold or flu:  . Wash your hands often with soap and warm water for at least 20 seconds.  If soap and water are not readily available, use an alcohol-based hand sanitizer with at least 60% alcohol.  . If coughing or sneezing, cover your mouth and nose by coughing or sneezing into the elbow areas of your shirt or coat, into a tissue or into your sleeve (not your hands). . Avoid shaking hands with others and consider head nods or verbal   greetings only. . Avoid touching your eyes, nose, or mouth with unwashed hands.  . Avoid close contact with people who are sick. . Avoid places or events with large numbers of people in one location, like concerts or sporting events. . Carefully consider travel plans you have or are making. . If you are planning any travel outside or inside the [place], visit the CDC's Travelers' Health webpage for the latest health notices. . If you have some symptoms but not all symptoms, continue to monitor at home and seek medical  attention if your symptoms worsen. . If you are having a medical emergency, call 911.  HOME CARE . Only take medications as instructed by your medical team. . Drink plenty of fluids and get plenty of rest. . A steam or ultrasonic humidifier can help if you have congestion.   GET HELP RIGHT AWAY IF YOU HAVE EMERGENCY WARNING SIGNS** FOR COVID-19. If you or someone is showing any of these signs seek emergency medical care immediately. Call 911 or proceed to your closest emergency facility if: . You develop worsening high fever. . Trouble breathing . Bluish lips or face . Persistent pain or pressure in the chest . New confusion . Inability to wake or stay awake . You cough up blood. . Your symptoms become more severe  **This list is not all possible symptoms. Contact your medical provider for any symptoms that are sever or concerning to you.   MAKE SURE YOU   Understand these instructions.  Will watch your condition.  Will get help right away if you are not doing well or get worse.  Your e-visit answers were reviewed by a board certified advanced clinical practitioner to complete your personal care plan.  Depending on the condition, your plan could have included both over the counter or prescription medications.  If there is a problem please reply once you have received a response from your provider.  Your safety is important to us.  If you have drug allergies check your prescription carefully.    You can use MyChart to ask questions about today's visit, request a non-urgent call back, or ask for a work or school excuse for 24 hours related to this e-Visit. If it has been greater than 24 hours you will need to follow up with your provider, or enter a new e-Visit to address those concerns. You will get an e-mail in the next two days asking about your experience.  I hope that your e-visit has been valuable and will speed your recovery. Thank you for using e-visits.   5-10 minutes  spent reviewing and documenting in chart.  

## 2019-04-14 DIAGNOSIS — H5213 Myopia, bilateral: Secondary | ICD-10-CM | POA: Diagnosis not present

## 2019-10-11 DIAGNOSIS — Z20822 Contact with and (suspected) exposure to covid-19: Secondary | ICD-10-CM | POA: Diagnosis not present

## 2019-10-19 ENCOUNTER — Telehealth: Payer: Self-pay | Admitting: Oncology

## 2019-10-19 ENCOUNTER — Telehealth: Payer: Self-pay | Admitting: Nurse Practitioner

## 2019-10-19 NOTE — Telephone Encounter (Signed)
Called to Discuss with patient about Covid symptoms and the use of the SQ monoclonal antibody injection for those who have been exposed to Covid and at a high risk of hospitalization.     Pt ws exposed greater than 4 days ago and unfortunately is out of the window for SQ injections. Advised to call back if she tests positive for covid and can be screened for infusion at that point.

## 2019-10-19 NOTE — Telephone Encounter (Signed)
Re: Covd Exposure  Mrs. Amey called the infusion clinic inquiring about MAB prophylaxis.  She was exposed to Covid through her children this past week.  Will refer to Mab injection clinic.  Referral sent.  Durenda Hurt, NP 10/19/2019 11:00 AM

## 2020-02-26 ENCOUNTER — Telehealth: Payer: 59 | Admitting: Emergency Medicine

## 2020-02-26 DIAGNOSIS — Z20822 Contact with and (suspected) exposure to covid-19: Secondary | ICD-10-CM

## 2020-02-26 DIAGNOSIS — J069 Acute upper respiratory infection, unspecified: Secondary | ICD-10-CM | POA: Diagnosis not present

## 2020-02-26 NOTE — Progress Notes (Signed)
E-Visit for Corona Virus Screening  Your current symptoms could be consistent with the coronavirus.  Many health care providers can now test patients at their office but not all are.  [facility] has multiple testing sites. For information on our COVID testing locations and hours go to [email or web address]  We are enrolling you in our MyChart Home Monitoring for COVID19 . Daily you will receive a questionnaire within the MyChart website. Our COVID 19 response team will be monitoring your responses daily.  Testing Information: The COVID-19 Community Testing sites are testing BY APPOINTMENT ONLY.  You can schedule online at [email or web address]  If you do not have access to a smart phone or computer you may call [phone] for an appointment.   Additional testing sites in the Community:  . For [facility] Testing sites in [facility]  [email or web address]  . For Pop-up testing sites in [place]  [email or web address]  . For [facility] [email or web address]  . For [facility] testing in [place] and [employer] [email or web address]  . For [facility] testing in [facility]   [email or web address]  For  more information about community testing call [phone]   Please quarantine yourself while awaiting your test results. Please stay home for a minimum of 10 days from the first day of illness with improving symptoms and you have had 24 hours of no fever (without the use of Tylenol (Acetaminophen) Motrin (Ibuprofen) or any fever reducing medication).  Also - Do not get tested prior to returning to work because once you have had a positive test the test can stay  positive for more than a month in some cases.   You should wear a mask or cloth face covering over your nose and mouth if you must be around other people or animals, including pets (even at home). Try to stay at least 6 feet away from other people. This will protect the people around you.  Please continue good preventive care measures, including:  frequent hand-washing, avoid touching your face, cover coughs/sneezes, stay out of crowds and keep a 6 foot distance from others.  COVID-19 is a respiratory illness with symptoms that are similar to the flu. Symptoms are typically mild to moderate, but there have been cases of severe illness and death due to the virus.   The following symptoms may appear 2-14 days after exposure: . Fever . Cough . Shortness of breath or difficulty breathing . Chills . Repeated shaking with chills . Muscle pain . Headache . Sore throat . New loss of taste or smell . Fatigue . Congestion or runny nose . Nausea or vomiting . Diarrhea  Go to the nearest hospital ED for assessment if fever/cough/breathlessness are severe or illness seems like a threat to life.  It is vitally important that if you feel that you have an infection such as this virus or any other virus that you stay home and away from places where you may spread it to others.  You should avoid contact with people age 28 and older.   You can use medication such as Delsym, available over the counter.  In my opinion it works the best, but most cough medicines don't work very well.  You may also take acetaminophen (Tylenol) as needed for fever.  Reduce your risk of any infection by using the same precautions used for avoiding the common cold or flu:  Marland Kitchen Wash your hands often with soap and warm water for at least 20 seconds.  If soap and water are not  readily available, use an alcohol-based hand sanitizer with at least 60% alcohol.  . If coughing or sneezing, cover your mouth and nose by coughing or sneezing  into the elbow areas of your shirt or coat, into a tissue or into your sleeve (not your hands). . Avoid shaking hands with others and consider head nods or verbal greetings only. . Avoid touching your eyes, nose, or mouth with unwashed hands.  . Avoid close contact with people who are sick. . Avoid places or events with large numbers of people in one location, like concerts or sporting events. . Carefully consider travel plans you have or are making. . If you are planning any travel outside or inside the [place], visit the CDC's Travelers' Health webpage for the latest health notices. . If you have some symptoms but not all symptoms, continue to monitor at home and seek medical attention if your symptoms worsen. . If you are having a medical emergency, call 911.  HOME CARE . Only take medications as instructed by your medical team. . Drink plenty of fluids and get plenty of rest. . A steam or ultrasonic humidifier can help if you have congestion.   GET HELP RIGHT AWAY IF YOU HAVE EMERGENCY WARNING SIGNS** FOR COVID-19. If you or someone is showing any of these signs seek emergency medical care immediately. Call 911 or proceed to your closest emergency facility if: . You develop worsening high fever. . Trouble breathing . Bluish lips or face . Persistent pain or pressure in the chest . New confusion . Inability to wake or stay awake . You cough up blood. . Your symptoms become more severe  **This list is not all possible symptoms. Contact your medical provider for any symptoms that are sever or concerning to you.  MAKE SURE YOU   Understand these instructions.  Will watch your condition.  Will get help right away if you are not doing well or get worse.  Your e-visit answers were reviewed by a board certified advanced clinical practitioner to complete your personal care plan.  Depending on the condition, your plan could have included both over the counter or prescription medications.  If  there is a problem please reply once you have received a response from your provider.  Your safety is important to [place].  If you have drug allergies check your prescription carefully.    You can use MyChart to ask questions about today's visit, request a non-urgent call back, or ask for a work or school excuse for 24 hours related to this e-Visit. If it has been greater than 24 hours you will need to follow up with your provider, or enter a new e-Visit to address those concerns. You will get an e-mail in the next two days asking about your experience.  I hope that your e-visit has been valuable and will speed your recovery. Thank you for using e-visits.   Approximately 5 minutes was used in reviewing the patient's chart, questionnaire, prescribing medications, and documentation.

## 2020-04-07 ENCOUNTER — Ambulatory Visit
Admission: EM | Admit: 2020-04-07 | Discharge: 2020-04-07 | Disposition: A | Discharge status: Home / Self Care | Payer: 59 | Attending: Family Medicine | Admitting: Family Medicine

## 2020-04-07 ENCOUNTER — Ambulatory Visit: Payer: 59

## 2020-04-07 ENCOUNTER — Other Ambulatory Visit: Payer: Self-pay

## 2020-04-07 ENCOUNTER — Ambulatory Visit (INDEPENDENT_AMBULATORY_CARE_PROVIDER_SITE_OTHER): Payer: 59

## 2020-04-07 DIAGNOSIS — J209 Acute bronchitis, unspecified: Secondary | ICD-10-CM

## 2020-04-07 DIAGNOSIS — J029 Acute pharyngitis, unspecified: Secondary | ICD-10-CM

## 2020-04-07 DIAGNOSIS — R0602 Shortness of breath: Secondary | ICD-10-CM | POA: Diagnosis not present

## 2020-04-07 DIAGNOSIS — J45901 Unspecified asthma with (acute) exacerbation: Secondary | ICD-10-CM

## 2020-04-07 DIAGNOSIS — U099 Post covid-19 condition, unspecified: Secondary | ICD-10-CM | POA: Diagnosis not present

## 2020-04-07 DIAGNOSIS — J40 Bronchitis, not specified as acute or chronic: Secondary | ICD-10-CM | POA: Diagnosis not present

## 2020-04-07 DIAGNOSIS — I10 Essential (primary) hypertension: Secondary | ICD-10-CM

## 2020-04-07 MED ORDER — BENZONATATE 200 MG PO CAPS: 200.0000 mg | ORAL_CAPSULE | Freq: Three times a day (TID) | ORAL | 0 refills | Status: AC | PRN

## 2020-04-07 MED ORDER — ALBUTEROL SULFATE HFA 108 (90 BASE) MCG/ACT IN AERS: 2.0000 | INHALATION_SPRAY | Freq: Four times a day (QID) | RESPIRATORY_TRACT | 0 refills | Status: AC | PRN

## 2020-04-07 MED ORDER — DOXYCYCLINE HYCLATE 100 MG PO CAPS: 100.0000 mg | ORAL_CAPSULE | Freq: Two times a day (BID) | ORAL | 0 refills | Status: AC

## 2020-04-07 MED ORDER — CARVEDILOL 6.25 MG PO TABS: 6.2500 mg | ORAL_TABLET | Freq: Two times a day (BID) | ORAL | 0 refills | Status: AC

## 2020-04-07 MED ORDER — PREDNISONE 20 MG PO TABS: 40.0000 mg | ORAL_TABLET | Freq: Every day | ORAL | 0 refills | Status: AC

## 2020-04-07 MED ORDER — IPRATROPIUM-ALBUTEROL 0.5-2.5 (3) MG/3ML IN SOLN: 3.0000 mL | Freq: Four times a day (QID) | RESPIRATORY_TRACT | 0 refills | Status: AC | PRN

## 2020-04-07 NOTE — ED Triage Notes (Signed)
Nasal congestion with cough since Friday, negative covid test.  Feels SOB, sneezing,

## 2020-04-07 NOTE — ED Provider Notes (Signed)
[facility]    CSN: [id number] Arrival date & time: 04/07/20  1227      History   Chief Complaint No chief complaint on file.   HPI Nancy Hayden is a 35 y.o. female.   HPI  Patient presents for several days of progressively worsening of cough, shortness of breath, and chest tightness following coughing.  Patient endorses years ago being diagnosed with reactive airway from of recurrent bronchitis in which she had to be placed on an albuterol inhaler during the course of the illness and treated with steroids.  Patient reports that shortness of breath became so significant last night that she did use her children's nebulizer machine and achieved relief of work of breathing.  She has been afebrile.  Cough is mostly nonproductive.    Elevated blood pressure reading with history of hypertension BP elevated times multiple readings today. Pt endorses hypertension during pregnancy x 6 years ago, however, never required medication treatment. She is experiencing daily lower extremity swelling and occasional headache. She reports being aware BP has recently been high. Family history of hypertension younger sister and mother both are treated for hypertension. No CP with the exception of chest tightness related to recent respiratory illness.  Past Medical History:  Diagnosis Date  . Fractured    rt elbow, rt wrist; rt and left ankle, nose ( h/o )  . GERD (gastroesophageal reflux disease)    takes tums as needed  . Headache(784.0)    tx with otc meds prn  . Heart murmur    as infant - no problems as adult  . Herniated disc    L4, L5 (history)  . Pregnancy induced hypertension    no meds  . Urinary tract infection    history    Patient Active Problem List   Diagnosis Date Noted  . S/P cesarean section 11/28/2013    Past Surgical History:  Procedure Laterality Date  . bladder stretch     as child  . CESAREAN SECTION  05/08/2011   Procedure: CESAREAN SECTION;  Surgeon:  Zelphia Cairo, MD;  Location: [facility];  Service: Gynecology;  Laterality: N/A;  PRIMARY  this case was scheduled with the permission of Dr Malen Gauze due to patient BMI and the need for the surgeon to have an assistant [facility] 05/13/11  . CESAREAN SECTION N/A 11/28/2013   Procedure: REPEAT CESAREAN SECTION;  Surgeon: Zelphia Cairo, MD;  Location: [facility];  Service: Obstetrics;  Laterality: N/A;  . CHOLECYSTECTOMY N/A 06/21/2015   Procedure: LAPAROSCOPIC CHOLECYSTECTOMY;  Surgeon: Abigail Miyamoto, MD;  Location: [facility];  Service: General;  Laterality: N/A;  . right hand surgery     for laceration  . spinal tap     as child  . tubes in ears     as child    OB History    Gravida  2   Para  2   Term  2   Preterm  0   AB  0   Living  2     SAB  0   IAB  0   Ectopic  0   Multiple  0   Live Births  2            Home Medications    Prior to Admission medications   Medication Sig Start Date End Date Taking? Authorizing Provider  albuterol (PROVENTIL HFA;VENTOLIN HFA) 108 (90 Base) MCG/ACT inhaler Inhale 2 puffs into the lungs every 6 (six) hours as needed for wheezing  or shortness of breath. 03/17/17   Withrow, Everardo AllJohn C, FNP  benzonatate (TESSALON PERLES) 100 MG capsule Take 1 capsule (100 mg total) by mouth 3 (three) times daily as needed. 12/14/18   Daphine DeutscherMartin, Mary-Margaret, FNP  ibuprofen (ADVIL,MOTRIN) 200 MG tablet Take 400-600 mg by mouth every 8 (eight) hours as needed for moderate pain.    [provider]  Naltrexone-buPROPion HCl ER 8-90 MG TB12 Take 2 tablets by mouth 2 (two) times daily.    [provider]  oxyCODONE-acetaminophen (ROXICET) 5-325 MG tablet Take 1-2 tablets by mouth every 4 (four) hours as needed for severe pain. 06/21/15   Abigail MiyamotoBlackman, Douglas, MD  predniSONE (DELTASONE) 5 MG tablet Take 1 tablet (5 mg total) by mouth as directed. Taper 6,5,4,3,2,1 03/17/17   Withrow, Everardo AllJohn C, FNP  ranitidine (ZANTAC) 75 MG tablet Take 300 mg by mouth every  morning.    [provider]    Family History Family History  Problem Relation Age of Onset  . Anesthesia problems Neg Hx   . Hypotension Neg Hx   . Malignant hyperthermia Neg Hx   . Pseudochol deficiency Neg Hx   . Hypertension Mother     Social History Social History   Tobacco Use  . Smoking status: Former Smoker    Packs/day: 0.25    Years: 3.00    Pack years: 0.75    Types: Cigarettes    Quit date: 02/02/2006    Years since quitting: 14.1  . Smokeless tobacco: Never Used  Substance Use Topics  . Alcohol use: Yes    Comment: occasional/  RARE  . Drug use: No     Allergies   Patient has no known allergies.   Review of Systems Review of Systems Pertinent negatives listed in HPI  Physical Exam Triage Vital Signs ED Triage Vitals  Enc Vitals Group     BP 04/07/20 1300 (!) 205/117     Pulse Rate 04/07/20 1255 98     Resp 04/07/20 1255 18     Temp 04/07/20 1255 98.6 F (37 C)     Temp Source 04/07/20 1255 Oral     SpO2 04/07/20 1255 98 %     Weight --      Height --      Head Circumference --      Peak Flow --      Pain Score 04/07/20 1257 5     Pain Loc --      Pain Edu? --      Excl. in GC? --    No data found.  Updated Vital Signs BP (!) 202/129 (BP Location: Right Arm)   Pulse 98   Temp 98.6 F (37 C) (Oral)   Resp 18   LMP 03/26/2020   SpO2 98%   Visual Acuity Right Eye Distance:   Left Eye Distance:   Bilateral Distance:    Right Eye Near:   Left Eye Near:    Bilateral Near:     Physical Exam Constitutional:      Appearance: She is obese. She is not ill-appearing or diaphoretic.  HENT:     Head: Normocephalic.  Eyes:     Extraocular Movements: Extraocular movements intact.     Pupils: Pupils are equal, round, and reactive to light.  Cardiovascular:     Rate and Rhythm: Normal rate and regular rhythm.  Pulmonary:     Breath sounds: Rhonchi present.     Comments: Expiratory wheeze present Coarse lungs sounds upper  lung fields with rhonchi present Musculoskeletal:     Cervical back: Normal range of motion.  Skin:    General: Skin is warm.     Capillary Refill: Capillary refill takes less than 2 seconds.  Neurological:     General: No focal deficit present.     Mental Status: She is alert. She is disoriented.  Psychiatric:        Mood and Affect: Mood normal.        Behavior: Behavior normal.        Thought Content: Thought content normal.        Judgment: Judgment normal.      UC Treatments / Results  Labs (all labs ordered are listed, but only abnormal results are displayed) Labs Reviewed - No data to display  EKG   Radiology No results found.  Procedures Procedures (including critical care time)  Medications Ordered in UC Medications - No data to display  Initial Impression / Assessment and Plan / UC Course  I have reviewed the triage vital signs and the nursing notes.  Pertinent labs & imaging results that were available during my care of the patient were reviewed by me and considered in my medical decision making (see chart for details).  Acute Bronchitis/Reactive Airway  Chest x-ray negative. Pt denies concern for possible COVID, fully vaccinated. Reassuring oxygen level is normal range. Will treat for acute bronchitis with Doxycycline and prednisone. Benzonatate for cough. PRN albuterol inhaler prescribed.  Nebulizer machine dispensed with prescription to use Duoneb solution for treatments every 6 hours as needed. Strict ER precautions given.   Elevated blood pressure with prior history of hypertension-gestational Elevated >200/100 x multiple readings and pt endorsed knowledge of elevated BP Trial Carvedilol 6.125 mg BID-no recent labs or recent physical exam and has chronic recurrent lower extremity swelling therefore, opt against starting on Amlodipine. Chest x-ray negative for cardiomegaly or effusions.  Advised to monitor BP at home to ensure therapy is  effective. Provided information to establish at [place] primary care. ER precautions given. Final Clinical Impressions(s) / UC Diagnoses   Final diagnoses:  Elevated blood pressure reading in office with diagnosis of hypertension  Acute bronchitis, unspecified organism  Reactive airway disease with acute exacerbation, unspecified asthma severity, unspecified whether persistent     Discharge Instructions     Schedule a new patient appointment with Dr. Allena Katz or one of the providers in his clinic for further work-up and evaluation of hypertension.  Starting on carvedilol you will take 1 tablet twice daily.  Recommend checking your blood pressure once starting medication to ensure that blood pressure medication at current dose is effective in controlling her blood pressure.  Your chest x-ray was normal.  Covering you for reactive airway exacerbation and acute bronchitis.  Use your albuterol inhaler 2 puffs every 4-6 hours as needed.  You were also prescribed duo nebulizer treatments this can be administered every 6 hours as needed.  Start prednisone 40 mg once daily with food for the next 5 days.  Prednisone can also increase your blood pressure therefore make sure you are limiting sodium intake.  And take doxycycline 100 mg twice daily for the next 10 days.    ED Prescriptions    Medication Sig Dispense Auth. Provider   carvedilol (COREG) 6.25 MG tablet Take 1 tablet (6.25 mg total) by mouth 2 (two) times daily with a meal. 60 tablet Bing Neighbors, FNP   predniSONE (DELTASONE) 20 MG tablet Take 2 tablets (40 mg total)  by mouth daily with breakfast. 10 tablet Bing Neighbors, FNP   albuterol (VENTOLIN HFA) 108 (90 Base) MCG/ACT inhaler Inhale 2 puffs into the lungs every 6 (six) hours as needed for wheezing or shortness of breath. 8 g Bing Neighbors, FNP   ipratropium-albuterol (DUONEB) 0.5-2.5 (3) MG/3ML SOLN Take 3 mLs by nebulization every 6 (six) hours as needed. 360 mL Bing Neighbors, FNP   benzonatate (TESSALON) 200 MG capsule Take 1 capsule (200 mg total) by mouth 3 (three) times daily as needed for cough. 30 capsule Bing Neighbors, FNP   doxycycline (VIBRAMYCIN) 100 MG capsule Take 1 capsule (100 mg total) by mouth 2 (two) times daily. 20 capsule Bing Neighbors, FNP     PDMP not reviewed this encounter.   Bing Neighbors, FNP 04/07/20 [phone]

## 2020-04-07 NOTE — Discharge Instructions (Addendum)
Schedule a new patient appointment with Dr. Allena Katz or one of the providers in his clinic for further work-up and evaluation of hypertension.  Starting on carvedilol you will take 1 tablet twice daily.  Recommend checking your blood pressure once starting medication to ensure that blood pressure medication at current dose is effective in controlling her blood pressure.  Your chest x-ray was normal.  Covering you for reactive airway exacerbation and acute bronchitis.  Use your albuterol inhaler 2 puffs every 4-6 hours as needed.  You were also prescribed duo nebulizer treatments this can be administered every 6 hours as needed.  Start prednisone 40 mg once daily with food for the next 5 days.  Prednisone can also increase your blood pressure therefore make sure you are limiting sodium intake.  And take doxycycline 100 mg twice daily for the next 10 days.

## 2020-04-18 DIAGNOSIS — R600 Localized edema: Secondary | ICD-10-CM | POA: Diagnosis not present

## 2020-04-18 DIAGNOSIS — Z131 Encounter for screening for diabetes mellitus: Secondary | ICD-10-CM | POA: Diagnosis not present

## 2020-04-18 DIAGNOSIS — I10 Essential (primary) hypertension: Secondary | ICD-10-CM | POA: Diagnosis not present

## 2020-04-18 DIAGNOSIS — Z114 Encounter for screening for human immunodeficiency virus [HIV]: Secondary | ICD-10-CM | POA: Diagnosis not present

## 2020-04-18 DIAGNOSIS — G43909 Migraine, unspecified, not intractable, without status migrainosus: Secondary | ICD-10-CM | POA: Diagnosis not present

## 2020-04-18 DIAGNOSIS — Z6841 Body Mass Index (BMI) 40.0 and over, adult: Secondary | ICD-10-CM | POA: Diagnosis not present

## 2020-04-18 DIAGNOSIS — Z13220 Encounter for screening for lipoid disorders: Secondary | ICD-10-CM | POA: Diagnosis not present

## 2020-04-18 DIAGNOSIS — Z1159 Encounter for screening for other viral diseases: Secondary | ICD-10-CM | POA: Diagnosis not present

## 2020-04-29 DIAGNOSIS — R748 Abnormal levels of other serum enzymes: Secondary | ICD-10-CM | POA: Diagnosis not present

## 2020-04-29 DIAGNOSIS — R600 Localized edema: Secondary | ICD-10-CM | POA: Diagnosis not present

## 2020-04-29 DIAGNOSIS — I10 Essential (primary) hypertension: Secondary | ICD-10-CM | POA: Diagnosis not present

## 2020-04-29 DIAGNOSIS — R739 Hyperglycemia, unspecified: Secondary | ICD-10-CM | POA: Diagnosis not present

## 2020-04-29 DIAGNOSIS — Z6841 Body Mass Index (BMI) 40.0 and over, adult: Secondary | ICD-10-CM | POA: Diagnosis not present

## 2020-04-29 DIAGNOSIS — G43909 Migraine, unspecified, not intractable, without status migrainosus: Secondary | ICD-10-CM | POA: Diagnosis not present

## 2020-06-03 DIAGNOSIS — Z6841 Body Mass Index (BMI) 40.0 and over, adult: Secondary | ICD-10-CM | POA: Diagnosis not present

## 2020-06-03 DIAGNOSIS — I10 Essential (primary) hypertension: Secondary | ICD-10-CM | POA: Diagnosis not present

## 2020-07-02 ENCOUNTER — Other Ambulatory Visit (HOSPITAL_COMMUNITY): Payer: Self-pay

## 2020-07-02 MED ORDER — OZEMPIC (0.25 OR 0.5 MG/DOSE) 2 MG/1.5ML ~~LOC~~ SOPN
0.5000 mg | PEN_INJECTOR | SUBCUTANEOUS | 1 refills | Status: DC
  Filled 2020-07-08: qty 1.5, 28d supply, fill #0

## 2020-07-02 MED ORDER — OLMESARTAN MEDOXOMIL-HCTZ 20-12.5 MG PO TABS
1.0000 | ORAL_TABLET | Freq: Every day | ORAL | 2 refills | Status: AC
  Filled 2020-07-02 – 2020-07-28 (×2): qty 30, 30d supply, fill #0
  Filled 2020-08-30: qty 30, 30d supply, fill #1

## 2020-07-08 ENCOUNTER — Other Ambulatory Visit (HOSPITAL_COMMUNITY): Payer: Self-pay

## 2020-07-09 ENCOUNTER — Other Ambulatory Visit (HOSPITAL_COMMUNITY): Payer: Self-pay

## 2020-07-29 ENCOUNTER — Other Ambulatory Visit (HOSPITAL_COMMUNITY): Payer: Self-pay

## 2020-07-30 ENCOUNTER — Other Ambulatory Visit (HOSPITAL_COMMUNITY): Payer: Self-pay

## 2020-08-12 ENCOUNTER — Other Ambulatory Visit (HOSPITAL_COMMUNITY): Payer: Self-pay

## 2020-08-12 DIAGNOSIS — Z01419 Encounter for gynecological examination (general) (routine) without abnormal findings: Secondary | ICD-10-CM | POA: Diagnosis not present

## 2020-08-12 DIAGNOSIS — Z6841 Body Mass Index (BMI) 40.0 and over, adult: Secondary | ICD-10-CM | POA: Diagnosis not present

## 2020-08-12 DIAGNOSIS — N946 Dysmenorrhea, unspecified: Secondary | ICD-10-CM | POA: Diagnosis not present

## 2020-08-12 MED ORDER — SEMAGLUTIDE(0.25 OR 0.5MG/DOS) 2 MG/1.5ML ~~LOC~~ SOPN
0.5000 mg | PEN_INJECTOR | SUBCUTANEOUS | 3 refills | Status: AC
Start: 2020-08-12 — End: ?
  Filled 2020-08-12: qty 1.5, 28d supply, fill #0
  Filled 2020-08-20 – 2020-08-30 (×2): qty 1.5, 28d supply, fill #1

## 2020-08-12 MED ORDER — NAPROXEN 500 MG PO TABS
500.0000 mg | ORAL_TABLET | Freq: Two times a day (BID) | ORAL | 2 refills | Status: AC
Start: 2020-08-12 — End: ?
  Filled 2020-08-12: qty 30, 15d supply, fill #0

## 2020-08-20 ENCOUNTER — Other Ambulatory Visit (HOSPITAL_COMMUNITY): Payer: Self-pay

## 2020-08-30 ENCOUNTER — Other Ambulatory Visit (HOSPITAL_COMMUNITY): Payer: Self-pay

## 2020-09-02 ENCOUNTER — Other Ambulatory Visit (HOSPITAL_COMMUNITY): Payer: Self-pay

## 2020-09-02 DIAGNOSIS — R739 Hyperglycemia, unspecified: Secondary | ICD-10-CM | POA: Diagnosis not present

## 2020-09-02 DIAGNOSIS — Z1389 Encounter for screening for other disorder: Secondary | ICD-10-CM | POA: Diagnosis not present

## 2020-09-02 DIAGNOSIS — I10 Essential (primary) hypertension: Secondary | ICD-10-CM | POA: Diagnosis not present

## 2020-09-02 DIAGNOSIS — Z6841 Body Mass Index (BMI) 40.0 and over, adult: Secondary | ICD-10-CM | POA: Diagnosis not present

## 2020-09-02 DIAGNOSIS — Z1331 Encounter for screening for depression: Secondary | ICD-10-CM | POA: Diagnosis not present

## 2020-09-02 MED ORDER — OZEMPIC (1 MG/DOSE) 4 MG/3ML ~~LOC~~ SOPN
1.0000 mg | PEN_INJECTOR | SUBCUTANEOUS | 5 refills | Status: DC
  Filled 2020-09-02: qty 3, 28d supply, fill #0
  Filled 2020-09-27: qty 3, 28d supply, fill #1
  Filled 2020-11-01: qty 3, 28d supply, fill #2
  Filled 2020-12-02: qty 3, 28d supply, fill #3
  Filled 2020-12-28: qty 3, 28d supply, fill #4
  Filled 2021-01-21: qty 3, 28d supply, fill #0
  Filled 2021-01-21: qty 3, 28d supply, fill #5

## 2020-09-02 MED ORDER — OLMESARTAN MEDOXOMIL-HCTZ 20-12.5 MG PO TABS
1.0000 | ORAL_TABLET | Freq: Every day | ORAL | 3 refills | Status: AC
  Filled 2020-09-02 – 2020-09-27 (×2): qty 90, 90d supply, fill #0
  Filled 2021-01-17: qty 90, 90d supply, fill #1
  Filled 2021-05-04: qty 90, 90d supply, fill #2
  Filled 2021-08-18: qty 90, 90d supply, fill #3

## 2020-09-27 ENCOUNTER — Other Ambulatory Visit (HOSPITAL_COMMUNITY): Payer: Self-pay

## 2020-10-15 DIAGNOSIS — Z20828 Contact with and (suspected) exposure to other viral communicable diseases: Secondary | ICD-10-CM | POA: Diagnosis not present

## 2020-10-15 DIAGNOSIS — R059 Cough, unspecified: Secondary | ICD-10-CM | POA: Diagnosis not present

## 2020-10-15 DIAGNOSIS — J209 Acute bronchitis, unspecified: Secondary | ICD-10-CM | POA: Diagnosis not present

## 2020-11-01 ENCOUNTER — Other Ambulatory Visit (HOSPITAL_COMMUNITY): Payer: Self-pay

## 2020-12-02 ENCOUNTER — Other Ambulatory Visit (HOSPITAL_COMMUNITY): Payer: Self-pay

## 2020-12-24 ENCOUNTER — Other Ambulatory Visit (HOSPITAL_COMMUNITY): Payer: Self-pay

## 2020-12-24 MED ORDER — MISOPROSTOL 200 MCG PO TABS
200.0000 ug | ORAL_TABLET | Freq: Every day | ORAL | 0 refills | Status: AC
Start: 2020-12-24 — End: ?
  Filled 2020-12-24: qty 1, 1d supply, fill #0

## 2020-12-30 ENCOUNTER — Other Ambulatory Visit (HOSPITAL_COMMUNITY): Payer: Self-pay

## 2021-01-06 DIAGNOSIS — Z30430 Encounter for insertion of intrauterine contraceptive device: Secondary | ICD-10-CM | POA: Diagnosis not present

## 2021-01-17 ENCOUNTER — Other Ambulatory Visit (HOSPITAL_COMMUNITY): Payer: Self-pay

## 2021-01-21 ENCOUNTER — Other Ambulatory Visit (HOSPITAL_COMMUNITY): Payer: Self-pay

## 2021-01-22 ENCOUNTER — Other Ambulatory Visit (HOSPITAL_COMMUNITY): Payer: Self-pay

## 2021-02-14 ENCOUNTER — Other Ambulatory Visit (HOSPITAL_COMMUNITY): Payer: Self-pay

## 2021-02-14 DIAGNOSIS — Z8639 Personal history of other endocrine, nutritional and metabolic disease: Secondary | ICD-10-CM | POA: Diagnosis not present

## 2021-02-14 DIAGNOSIS — E669 Obesity, unspecified: Secondary | ICD-10-CM | POA: Diagnosis not present

## 2021-02-14 DIAGNOSIS — I10 Essential (primary) hypertension: Secondary | ICD-10-CM | POA: Diagnosis not present

## 2021-02-14 DIAGNOSIS — Z6841 Body Mass Index (BMI) 40.0 and over, adult: Secondary | ICD-10-CM | POA: Diagnosis not present

## 2021-02-14 DIAGNOSIS — H5203 Hypermetropia, bilateral: Secondary | ICD-10-CM | POA: Diagnosis not present

## 2021-02-14 MED ORDER — OZEMPIC (2 MG/DOSE) 8 MG/3ML ~~LOC~~ SOPN
PEN_INJECTOR | SUBCUTANEOUS | 3 refills | Status: DC
  Filled 2021-02-14: qty 6, 56d supply, fill #0
  Filled 2021-04-08: qty 6, 56d supply, fill #1

## 2021-03-07 DIAGNOSIS — Z309 Encounter for contraceptive management, unspecified: Secondary | ICD-10-CM | POA: Diagnosis not present

## 2021-04-08 ENCOUNTER — Other Ambulatory Visit (HOSPITAL_COMMUNITY): Payer: Self-pay

## 2021-04-09 ENCOUNTER — Other Ambulatory Visit (HOSPITAL_COMMUNITY): Payer: Self-pay

## 2021-04-09 MED ORDER — WEGOVY 1.7 MG/0.75ML ~~LOC~~ SOAJ
SUBCUTANEOUS | 0 refills | Status: AC
  Filled 2021-04-09: qty 3, 28d supply, fill #0
  Filled 2021-07-07: qty 3, 28d supply, fill #1
  Filled 2021-08-18: qty 3, 28d supply, fill #2

## 2021-04-26 ENCOUNTER — Other Ambulatory Visit (HOSPITAL_COMMUNITY): Payer: Self-pay

## 2021-04-28 ENCOUNTER — Other Ambulatory Visit (HOSPITAL_COMMUNITY): Payer: Self-pay

## 2021-05-05 ENCOUNTER — Other Ambulatory Visit (HOSPITAL_COMMUNITY): Payer: Self-pay

## 2021-07-08 ENCOUNTER — Other Ambulatory Visit (HOSPITAL_COMMUNITY): Payer: Self-pay

## 2021-07-16 ENCOUNTER — Other Ambulatory Visit (HOSPITAL_COMMUNITY): Payer: Self-pay

## 2021-08-08 DIAGNOSIS — I10 Essential (primary) hypertension: Secondary | ICD-10-CM | POA: Diagnosis not present

## 2021-08-08 DIAGNOSIS — R748 Abnormal levels of other serum enzymes: Secondary | ICD-10-CM | POA: Diagnosis not present

## 2021-08-08 DIAGNOSIS — R739 Hyperglycemia, unspecified: Secondary | ICD-10-CM | POA: Diagnosis not present

## 2021-08-18 ENCOUNTER — Other Ambulatory Visit (HOSPITAL_COMMUNITY): Payer: Self-pay

## 2021-08-18 DIAGNOSIS — I10 Essential (primary) hypertension: Secondary | ICD-10-CM | POA: Diagnosis not present

## 2021-08-18 DIAGNOSIS — E669 Obesity, unspecified: Secondary | ICD-10-CM | POA: Diagnosis not present

## 2021-08-18 DIAGNOSIS — R748 Abnormal levels of other serum enzymes: Secondary | ICD-10-CM | POA: Diagnosis not present

## 2021-08-18 DIAGNOSIS — Z8639 Personal history of other endocrine, nutritional and metabolic disease: Secondary | ICD-10-CM | POA: Diagnosis not present

## 2021-08-18 DIAGNOSIS — Z6841 Body Mass Index (BMI) 40.0 and over, adult: Secondary | ICD-10-CM | POA: Diagnosis not present

## 2021-08-18 MED ORDER — OLMESARTAN MEDOXOMIL-HCTZ 20-12.5 MG PO TABS
1.0000 | ORAL_TABLET | Freq: Every day | ORAL | 1 refills | Status: DC
  Filled 2021-08-18 – 2021-12-29 (×2): qty 90, 90d supply, fill #0
  Filled 2022-04-01: qty 90, 90d supply, fill #1

## 2021-08-18 MED ORDER — WEGOVY 2.4 MG/0.75ML ~~LOC~~ SOAJ
2.4000 mg | SUBCUTANEOUS | 2 refills | Status: DC
  Filled 2021-08-18 – 2021-08-20 (×2): qty 3, 28d supply, fill #0
  Filled 2021-09-12: qty 3, 28d supply, fill #1
  Filled 2021-10-10: qty 3, 28d supply, fill #2

## 2021-08-20 ENCOUNTER — Other Ambulatory Visit (HOSPITAL_COMMUNITY): Payer: Self-pay

## 2021-09-12 ENCOUNTER — Other Ambulatory Visit (HOSPITAL_COMMUNITY): Payer: Self-pay

## 2021-09-30 ENCOUNTER — Other Ambulatory Visit (HOSPITAL_COMMUNITY): Payer: Self-pay

## 2021-10-10 ENCOUNTER — Other Ambulatory Visit (HOSPITAL_COMMUNITY): Payer: Self-pay

## 2021-10-10 DIAGNOSIS — Z6841 Body Mass Index (BMI) 40.0 and over, adult: Secondary | ICD-10-CM | POA: Diagnosis not present

## 2021-10-10 DIAGNOSIS — Z01419 Encounter for gynecological examination (general) (routine) without abnormal findings: Secondary | ICD-10-CM | POA: Diagnosis not present

## 2021-10-10 DIAGNOSIS — Z1151 Encounter for screening for human papillomavirus (HPV): Secondary | ICD-10-CM | POA: Diagnosis not present

## 2021-10-10 DIAGNOSIS — Z124 Encounter for screening for malignant neoplasm of cervix: Secondary | ICD-10-CM | POA: Diagnosis not present

## 2021-11-07 ENCOUNTER — Other Ambulatory Visit (HOSPITAL_COMMUNITY): Payer: Self-pay

## 2021-11-10 ENCOUNTER — Other Ambulatory Visit (HOSPITAL_COMMUNITY): Payer: Self-pay

## 2021-11-10 MED ORDER — SEMAGLUTIDE-WEIGHT MANAGEMENT 2.4 MG/0.75ML ~~LOC~~ SOAJ
2.4000 mg | SUBCUTANEOUS | 2 refills | Status: DC
  Filled 2021-11-10: qty 3, 28d supply, fill #0
  Filled 2021-12-05 – 2021-12-06 (×2): qty 3, 28d supply, fill #1
  Filled 2021-12-29: qty 3, 28d supply, fill #2

## 2021-11-12 ENCOUNTER — Other Ambulatory Visit (HOSPITAL_COMMUNITY): Payer: Self-pay

## 2021-11-12 DIAGNOSIS — R748 Abnormal levels of other serum enzymes: Secondary | ICD-10-CM | POA: Diagnosis not present

## 2021-11-12 DIAGNOSIS — R739 Hyperglycemia, unspecified: Secondary | ICD-10-CM | POA: Diagnosis not present

## 2021-11-17 DIAGNOSIS — Z1331 Encounter for screening for depression: Secondary | ICD-10-CM | POA: Diagnosis not present

## 2021-11-17 DIAGNOSIS — F988 Other specified behavioral and emotional disorders with onset usually occurring in childhood and adolescence: Secondary | ICD-10-CM | POA: Diagnosis not present

## 2021-11-17 DIAGNOSIS — Z6841 Body Mass Index (BMI) 40.0 and over, adult: Secondary | ICD-10-CM | POA: Diagnosis not present

## 2021-11-17 DIAGNOSIS — E669 Obesity, unspecified: Secondary | ICD-10-CM | POA: Diagnosis not present

## 2021-11-17 DIAGNOSIS — Z1389 Encounter for screening for other disorder: Secondary | ICD-10-CM | POA: Diagnosis not present

## 2021-11-17 DIAGNOSIS — R748 Abnormal levels of other serum enzymes: Secondary | ICD-10-CM | POA: Diagnosis not present

## 2021-11-17 DIAGNOSIS — I10 Essential (primary) hypertension: Secondary | ICD-10-CM | POA: Diagnosis not present

## 2021-12-06 ENCOUNTER — Other Ambulatory Visit (HOSPITAL_COMMUNITY): Payer: Self-pay

## 2021-12-30 ENCOUNTER — Other Ambulatory Visit (HOSPITAL_COMMUNITY): Payer: Self-pay

## 2021-12-31 ENCOUNTER — Other Ambulatory Visit (HOSPITAL_COMMUNITY): Payer: Self-pay

## 2022-01-26 ENCOUNTER — Other Ambulatory Visit (HOSPITAL_COMMUNITY): Payer: Self-pay

## 2022-01-28 ENCOUNTER — Other Ambulatory Visit: Payer: Self-pay

## 2022-01-28 ENCOUNTER — Other Ambulatory Visit (HOSPITAL_COMMUNITY): Payer: Self-pay

## 2022-01-28 MED ORDER — WEGOVY 2.4 MG/0.75ML ~~LOC~~ SOAJ
2.4000 mg | SUBCUTANEOUS | 2 refills | Status: DC
  Filled 2022-01-28 (×2): qty 3, 28d supply, fill #0
  Filled 2022-02-23: qty 3, 28d supply, fill #1
  Filled 2022-03-18: qty 3, 28d supply, fill #2

## 2022-02-23 ENCOUNTER — Other Ambulatory Visit (HOSPITAL_COMMUNITY): Payer: Self-pay

## 2022-02-24 ENCOUNTER — Other Ambulatory Visit: Payer: Self-pay

## 2022-03-18 ENCOUNTER — Other Ambulatory Visit: Payer: Self-pay

## 2022-03-19 ENCOUNTER — Other Ambulatory Visit: Payer: Self-pay

## 2022-04-01 ENCOUNTER — Other Ambulatory Visit: Payer: Self-pay

## 2022-04-01 ENCOUNTER — Other Ambulatory Visit (HOSPITAL_COMMUNITY): Payer: Self-pay

## 2022-04-01 MED ORDER — SEMAGLUTIDE-WEIGHT MANAGEMENT 2.4 MG/0.75ML ~~LOC~~ SOAJ
2.4000 mg | SUBCUTANEOUS | 2 refills | Status: AC
  Filled 2022-04-01 – 2022-04-28 (×6): qty 3, 28d supply, fill #0
  Filled 2022-05-18 – 2022-05-21 (×2): qty 3, 28d supply, fill #1
  Filled 2022-10-19: qty 3, 28d supply, fill #2

## 2022-04-02 ENCOUNTER — Other Ambulatory Visit (HOSPITAL_COMMUNITY): Payer: Self-pay

## 2022-04-06 ENCOUNTER — Other Ambulatory Visit (HOSPITAL_COMMUNITY): Payer: Self-pay

## 2022-04-06 ENCOUNTER — Other Ambulatory Visit: Payer: Self-pay

## 2022-04-06 DIAGNOSIS — Z6841 Body Mass Index (BMI) 40.0 and over, adult: Secondary | ICD-10-CM | POA: Diagnosis not present

## 2022-04-06 DIAGNOSIS — R030 Elevated blood-pressure reading, without diagnosis of hypertension: Secondary | ICD-10-CM | POA: Diagnosis not present

## 2022-04-06 DIAGNOSIS — E669 Obesity, unspecified: Secondary | ICD-10-CM | POA: Diagnosis not present

## 2022-04-09 ENCOUNTER — Other Ambulatory Visit: Payer: Self-pay

## 2022-04-09 ENCOUNTER — Other Ambulatory Visit (HOSPITAL_COMMUNITY): Payer: Self-pay

## 2022-04-13 ENCOUNTER — Other Ambulatory Visit: Payer: Self-pay

## 2022-04-18 ENCOUNTER — Other Ambulatory Visit (HOSPITAL_COMMUNITY): Payer: Self-pay

## 2022-04-28 ENCOUNTER — Encounter (HOSPITAL_COMMUNITY): Payer: Self-pay

## 2022-04-28 ENCOUNTER — Other Ambulatory Visit (HOSPITAL_COMMUNITY): Payer: Self-pay

## 2022-04-28 ENCOUNTER — Other Ambulatory Visit: Payer: Self-pay

## 2022-05-05 DIAGNOSIS — Z6841 Body Mass Index (BMI) 40.0 and over, adult: Secondary | ICD-10-CM | POA: Diagnosis not present

## 2022-05-05 DIAGNOSIS — R030 Elevated blood-pressure reading, without diagnosis of hypertension: Secondary | ICD-10-CM | POA: Diagnosis not present

## 2022-05-05 DIAGNOSIS — J069 Acute upper respiratory infection, unspecified: Secondary | ICD-10-CM | POA: Diagnosis not present

## 2022-05-05 DIAGNOSIS — B349 Viral infection, unspecified: Secondary | ICD-10-CM | POA: Diagnosis not present

## 2022-05-18 ENCOUNTER — Other Ambulatory Visit: Payer: Self-pay

## 2022-05-21 ENCOUNTER — Other Ambulatory Visit: Payer: Self-pay

## 2022-05-21 ENCOUNTER — Other Ambulatory Visit (HOSPITAL_COMMUNITY): Payer: Self-pay

## 2022-05-25 ENCOUNTER — Other Ambulatory Visit (HOSPITAL_COMMUNITY): Payer: Self-pay

## 2022-05-25 ENCOUNTER — Other Ambulatory Visit: Payer: Self-pay

## 2022-05-25 DIAGNOSIS — F988 Other specified behavioral and emotional disorders with onset usually occurring in childhood and adolescence: Secondary | ICD-10-CM | POA: Diagnosis not present

## 2022-05-25 DIAGNOSIS — Z6841 Body Mass Index (BMI) 40.0 and over, adult: Secondary | ICD-10-CM | POA: Diagnosis not present

## 2022-05-25 DIAGNOSIS — I10 Essential (primary) hypertension: Secondary | ICD-10-CM | POA: Diagnosis not present

## 2022-05-25 DIAGNOSIS — E669 Obesity, unspecified: Secondary | ICD-10-CM | POA: Diagnosis not present

## 2022-05-25 MED ORDER — OLMESARTAN MEDOXOMIL-HCTZ 20-12.5 MG PO TABS
1.0000 | ORAL_TABLET | Freq: Every day | ORAL | 1 refills | Status: DC
  Filled 2022-05-25 – 2022-08-08 (×2): qty 90, 90d supply, fill #0
  Filled 2022-10-30: qty 90, 90d supply, fill #1

## 2022-05-25 MED ORDER — WEGOVY 2.4 MG/0.75ML ~~LOC~~ SOAJ
2.4000 mg | SUBCUTANEOUS | 2 refills | Status: AC
  Filled 2022-05-25 – 2022-06-19 (×2): qty 3, 28d supply, fill #0

## 2022-05-25 MED ORDER — ATOMOXETINE HCL 25 MG PO CAPS
25.0000 mg | ORAL_CAPSULE | Freq: Every day | ORAL | 2 refills | Status: DC
Start: 2022-05-25 — End: 2022-08-21
  Filled 2022-05-25: qty 30, 30d supply, fill #0
  Filled 2022-06-30: qty 30, 30d supply, fill #1

## 2022-05-26 ENCOUNTER — Other Ambulatory Visit (HOSPITAL_COMMUNITY): Payer: Self-pay

## 2022-06-19 ENCOUNTER — Other Ambulatory Visit (HOSPITAL_COMMUNITY): Payer: Self-pay

## 2022-06-19 ENCOUNTER — Other Ambulatory Visit: Payer: Self-pay

## 2022-06-30 ENCOUNTER — Other Ambulatory Visit: Payer: Self-pay

## 2022-06-30 ENCOUNTER — Other Ambulatory Visit (HOSPITAL_COMMUNITY): Payer: Self-pay

## 2022-07-03 ENCOUNTER — Other Ambulatory Visit: Payer: Self-pay

## 2022-08-10 ENCOUNTER — Other Ambulatory Visit (HOSPITAL_COMMUNITY): Payer: Self-pay

## 2022-08-10 ENCOUNTER — Other Ambulatory Visit: Payer: Self-pay

## 2022-08-11 ENCOUNTER — Other Ambulatory Visit: Payer: Self-pay

## 2022-08-14 DIAGNOSIS — R748 Abnormal levels of other serum enzymes: Secondary | ICD-10-CM | POA: Diagnosis not present

## 2022-08-14 DIAGNOSIS — Z1329 Encounter for screening for other suspected endocrine disorder: Secondary | ICD-10-CM | POA: Diagnosis not present

## 2022-08-14 DIAGNOSIS — R739 Hyperglycemia, unspecified: Secondary | ICD-10-CM | POA: Diagnosis not present

## 2022-08-14 DIAGNOSIS — Z13220 Encounter for screening for lipoid disorders: Secondary | ICD-10-CM | POA: Diagnosis not present

## 2022-08-21 ENCOUNTER — Other Ambulatory Visit (HOSPITAL_BASED_OUTPATIENT_CLINIC_OR_DEPARTMENT_OTHER): Payer: Self-pay

## 2022-08-21 ENCOUNTER — Other Ambulatory Visit (HOSPITAL_COMMUNITY): Payer: Self-pay

## 2022-08-21 DIAGNOSIS — F988 Other specified behavioral and emotional disorders with onset usually occurring in childhood and adolescence: Secondary | ICD-10-CM | POA: Diagnosis not present

## 2022-08-21 DIAGNOSIS — I10 Essential (primary) hypertension: Secondary | ICD-10-CM | POA: Diagnosis not present

## 2022-08-21 DIAGNOSIS — E669 Obesity, unspecified: Secondary | ICD-10-CM | POA: Diagnosis not present

## 2022-08-21 DIAGNOSIS — Z6841 Body Mass Index (BMI) 40.0 and over, adult: Secondary | ICD-10-CM | POA: Diagnosis not present

## 2022-08-22 ENCOUNTER — Other Ambulatory Visit (HOSPITAL_COMMUNITY): Payer: Self-pay

## 2022-08-22 MED ORDER — AMPHETAMINE-DEXTROAMPHET ER 10 MG PO CP24
10.0000 mg | ORAL_CAPSULE | Freq: Every morning | ORAL | 0 refills | Status: AC
Start: 2022-10-20 — End: ?

## 2022-08-22 MED ORDER — AMPHETAMINE-DEXTROAMPHET ER 10 MG PO CP24
10.0000 mg | ORAL_CAPSULE | Freq: Every morning | ORAL | 0 refills | Status: AC
  Filled 2022-08-22: qty 30, 30d supply, fill #0

## 2022-08-22 MED ORDER — AMPHETAMINE-DEXTROAMPHET ER 10 MG PO CP24
10.0000 mg | ORAL_CAPSULE | Freq: Every morning | ORAL | 0 refills | Status: AC
  Filled 2022-09-28: qty 10, 10d supply, fill #0
  Filled 2022-09-30: qty 20, 20d supply, fill #0

## 2022-08-24 ENCOUNTER — Other Ambulatory Visit: Payer: Self-pay

## 2022-08-24 ENCOUNTER — Other Ambulatory Visit (HOSPITAL_COMMUNITY): Payer: Self-pay

## 2022-09-10 ENCOUNTER — Other Ambulatory Visit: Payer: Self-pay

## 2022-09-10 ENCOUNTER — Other Ambulatory Visit (HOSPITAL_COMMUNITY): Payer: Self-pay

## 2022-09-10 DIAGNOSIS — N898 Other specified noninflammatory disorders of vagina: Secondary | ICD-10-CM | POA: Diagnosis not present

## 2022-09-10 DIAGNOSIS — N930 Postcoital and contact bleeding: Secondary | ICD-10-CM | POA: Diagnosis not present

## 2022-09-10 MED ORDER — DOXYCYCLINE HYCLATE 100 MG PO TABS
100.0000 mg | ORAL_TABLET | Freq: Two times a day (BID) | ORAL | 0 refills | Status: AC
  Filled 2022-09-10: qty 14, 7d supply, fill #0

## 2022-09-28 ENCOUNTER — Other Ambulatory Visit: Payer: Self-pay

## 2022-09-28 ENCOUNTER — Other Ambulatory Visit (HOSPITAL_COMMUNITY): Payer: Self-pay

## 2022-09-28 ENCOUNTER — Encounter (HOSPITAL_COMMUNITY): Payer: Self-pay

## 2022-09-29 ENCOUNTER — Other Ambulatory Visit: Payer: Self-pay

## 2022-09-30 ENCOUNTER — Other Ambulatory Visit: Payer: Self-pay

## 2022-09-30 ENCOUNTER — Other Ambulatory Visit (HOSPITAL_COMMUNITY): Payer: Self-pay

## 2022-10-03 IMAGING — DX DG CHEST 2V
2 series · 2 of 2 positions shown · non-contrast
Comparison: None.

CLINICAL DATA: Shortness of breath. Post [id number] greater than 30
days ago.

EXAM:
CHEST - 2 VIEW

[chest pa]
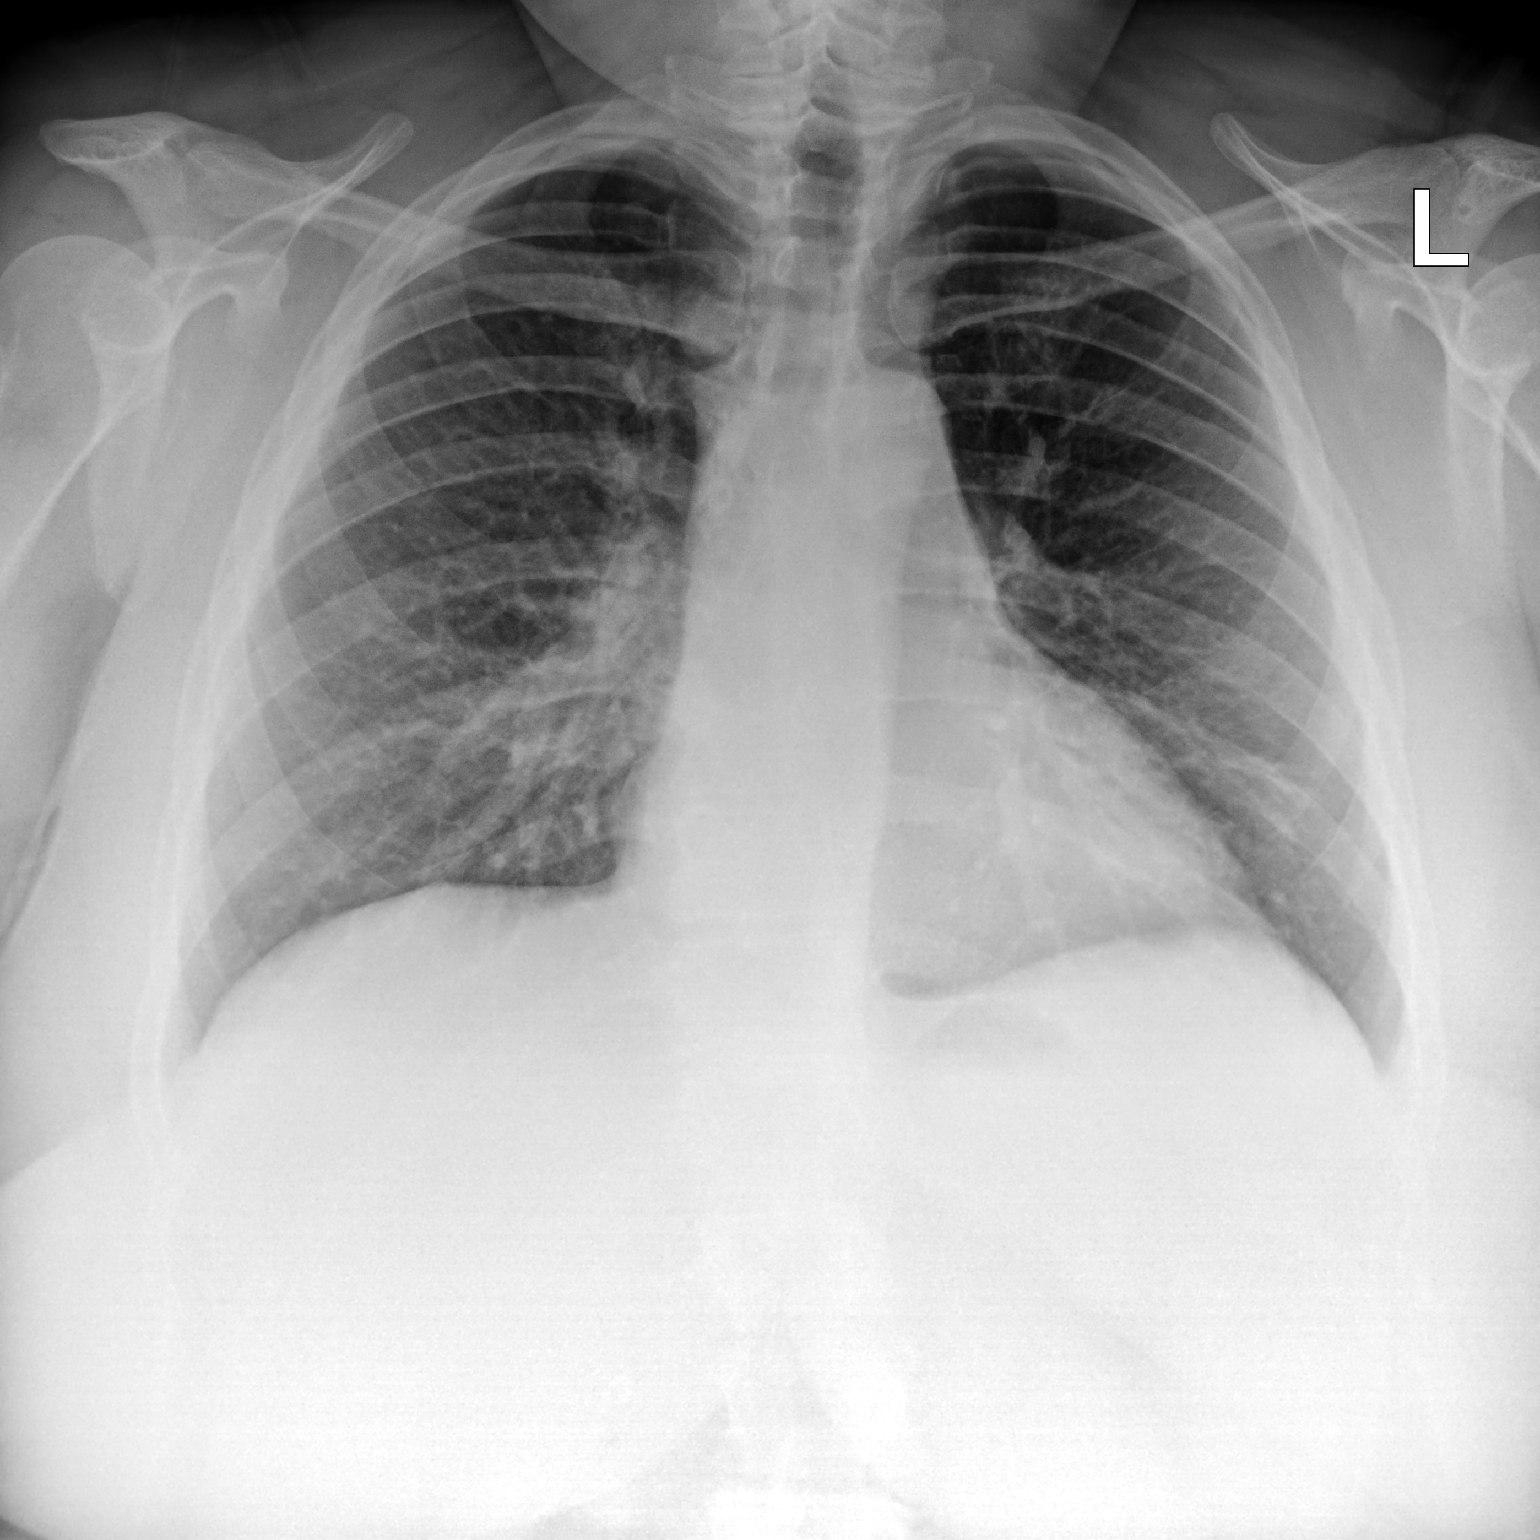

[chest lat]
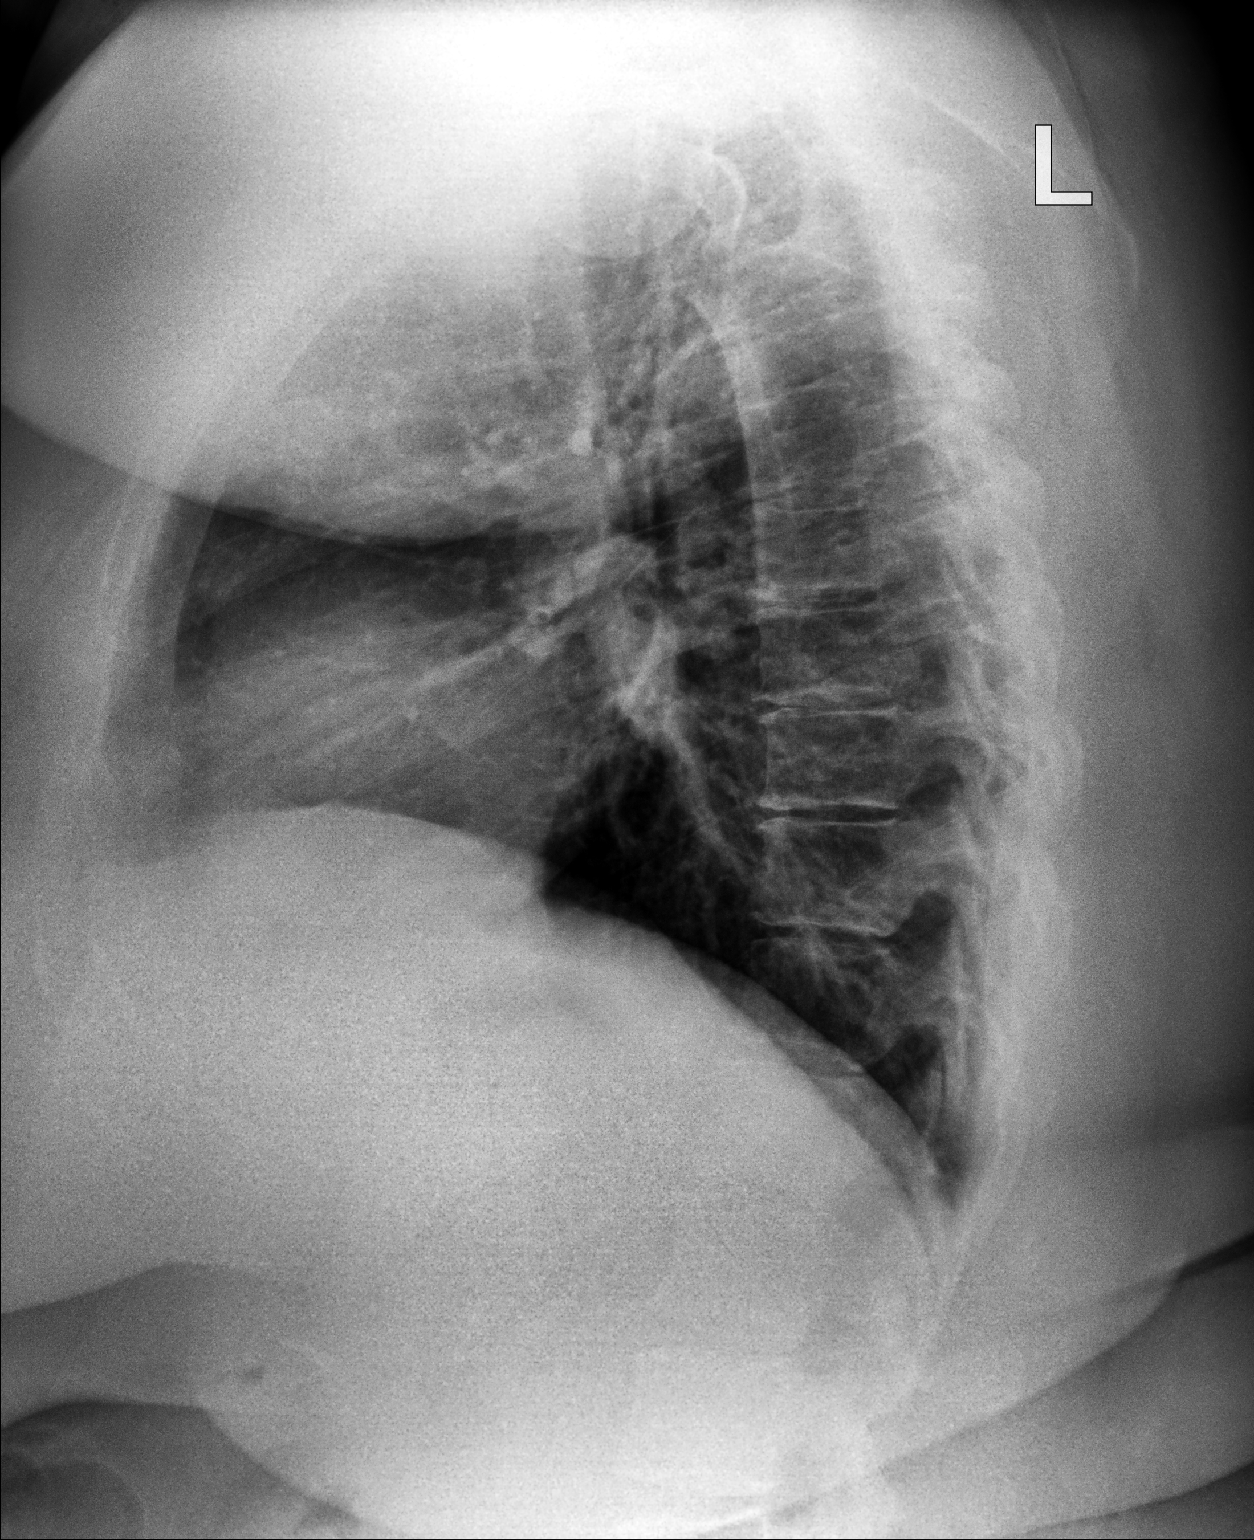

[2 of 2 positions shown; findings below may reference images not displayed]

FINDINGS: The heart size and mediastinal contours are within normal limits.
Both lungs are clear. The visualized skeletal structures are
unremarkable.
IMPRESSION: No active cardiopulmonary disease.

## 2022-10-12 DIAGNOSIS — Z01419 Encounter for gynecological examination (general) (routine) without abnormal findings: Secondary | ICD-10-CM | POA: Diagnosis not present

## 2022-10-12 DIAGNOSIS — Z6841 Body Mass Index (BMI) 40.0 and over, adult: Secondary | ICD-10-CM | POA: Diagnosis not present

## 2022-10-19 ENCOUNTER — Other Ambulatory Visit (HOSPITAL_COMMUNITY): Payer: Self-pay

## 2022-10-30 ENCOUNTER — Other Ambulatory Visit: Payer: Self-pay

## 2022-10-30 ENCOUNTER — Other Ambulatory Visit (HOSPITAL_COMMUNITY): Payer: Self-pay

## 2022-11-04 ENCOUNTER — Other Ambulatory Visit: Payer: Self-pay

## 2022-11-04 ENCOUNTER — Other Ambulatory Visit (HOSPITAL_COMMUNITY): Payer: Self-pay

## 2022-11-04 MED ORDER — AMPHETAMINE-DEXTROAMPHET ER 10 MG PO CP24
10.0000 mg | ORAL_CAPSULE | Freq: Every morning | ORAL | 0 refills | Status: DC
  Filled 2022-11-04: qty 30, 30d supply, fill #0

## 2022-11-06 DIAGNOSIS — Z1231 Encounter for screening mammogram for malignant neoplasm of breast: Secondary | ICD-10-CM | POA: Diagnosis not present

## 2022-12-03 ENCOUNTER — Other Ambulatory Visit (HOSPITAL_COMMUNITY): Payer: Self-pay

## 2022-12-03 ENCOUNTER — Other Ambulatory Visit: Payer: Self-pay

## 2022-12-03 MED ORDER — AMPHETAMINE-DEXTROAMPHET ER 10 MG PO CP24
10.0000 mg | ORAL_CAPSULE | Freq: Every morning | ORAL | 0 refills | Status: DC
  Filled 2022-12-03: qty 30, 30d supply, fill #0

## 2022-12-07 DIAGNOSIS — E669 Obesity, unspecified: Secondary | ICD-10-CM | POA: Diagnosis not present

## 2022-12-07 DIAGNOSIS — Z6841 Body Mass Index (BMI) 40.0 and over, adult: Secondary | ICD-10-CM | POA: Diagnosis not present

## 2022-12-07 DIAGNOSIS — F988 Other specified behavioral and emotional disorders with onset usually occurring in childhood and adolescence: Secondary | ICD-10-CM | POA: Diagnosis not present

## 2022-12-07 DIAGNOSIS — I10 Essential (primary) hypertension: Secondary | ICD-10-CM | POA: Diagnosis not present

## 2022-12-08 ENCOUNTER — Other Ambulatory Visit (HOSPITAL_COMMUNITY): Payer: Self-pay

## 2022-12-08 MED ORDER — AMPHETAMINE-DEXTROAMPHET ER 20 MG PO CP24
20.0000 mg | ORAL_CAPSULE | Freq: Every morning | ORAL | 0 refills | Status: DC
  Filled 2022-12-08: qty 30, 30d supply, fill #0

## 2022-12-08 MED ORDER — AMPHETAMINE-DEXTROAMPHET ER 20 MG PO CP24
20.0000 mg | ORAL_CAPSULE | Freq: Every morning | ORAL | 0 refills | Status: AC
Start: 2023-06-05 — End: ?

## 2022-12-08 MED ORDER — AMPHETAMINE-DEXTROAMPHET ER 20 MG PO CP24
20.0000 mg | ORAL_CAPSULE | Freq: Every morning | ORAL | 0 refills | Status: AC
  Filled 2023-03-08: qty 30, 30d supply, fill #0

## 2022-12-08 MED ORDER — OLMESARTAN MEDOXOMIL-HCTZ 20-12.5 MG PO TABS
0.5000 | ORAL_TABLET | Freq: Every day | ORAL | 1 refills | Status: AC
Start: 2022-12-07 — End: ?
  Filled 2022-12-08 – 2023-01-22 (×2): qty 45, 90d supply, fill #0

## 2022-12-10 ENCOUNTER — Other Ambulatory Visit: Payer: Self-pay

## 2022-12-10 ENCOUNTER — Other Ambulatory Visit (HOSPITAL_COMMUNITY): Payer: Self-pay

## 2022-12-22 ENCOUNTER — Other Ambulatory Visit (HOSPITAL_COMMUNITY): Payer: Self-pay

## 2023-01-18 DIAGNOSIS — Z6841 Body Mass Index (BMI) 40.0 and over, adult: Secondary | ICD-10-CM | POA: Diagnosis not present

## 2023-01-18 DIAGNOSIS — R030 Elevated blood-pressure reading, without diagnosis of hypertension: Secondary | ICD-10-CM | POA: Diagnosis not present

## 2023-01-18 DIAGNOSIS — J209 Acute bronchitis, unspecified: Secondary | ICD-10-CM | POA: Diagnosis not present

## 2023-01-18 DIAGNOSIS — Z20828 Contact with and (suspected) exposure to other viral communicable diseases: Secondary | ICD-10-CM | POA: Diagnosis not present

## 2023-01-22 ENCOUNTER — Other Ambulatory Visit: Payer: Self-pay

## 2023-01-22 ENCOUNTER — Other Ambulatory Visit (HOSPITAL_COMMUNITY): Payer: Self-pay

## 2023-01-23 ENCOUNTER — Other Ambulatory Visit (HOSPITAL_COMMUNITY): Payer: Self-pay

## 2023-01-23 MED ORDER — AMPHETAMINE-DEXTROAMPHET ER 20 MG PO CP24
20.0000 mg | ORAL_CAPSULE | Freq: Every morning | ORAL | 0 refills | Status: AC
Start: 2023-01-23 — End: ?
  Filled 2023-01-23: qty 30, 30d supply, fill #0

## 2023-03-01 ENCOUNTER — Other Ambulatory Visit (HOSPITAL_COMMUNITY): Payer: Self-pay

## 2023-03-08 ENCOUNTER — Other Ambulatory Visit (HOSPITAL_COMMUNITY): Payer: Self-pay

## 2023-03-09 ENCOUNTER — Other Ambulatory Visit: Payer: Self-pay

## 2023-03-23 ENCOUNTER — Other Ambulatory Visit (HOSPITAL_COMMUNITY): Payer: Self-pay

## 2023-03-23 MED ORDER — AMPHETAMINE-DEXTROAMPHET ER 30 MG PO CP24
30.0000 mg | ORAL_CAPSULE | Freq: Every morning | ORAL | 0 refills | Status: DC
  Filled 2023-03-23: qty 90, 90d supply, fill #0

## 2023-03-23 MED ORDER — OLMESARTAN MEDOXOMIL-HCTZ 20-12.5 MG PO TABS
1.0000 | ORAL_TABLET | Freq: Every day | ORAL | 0 refills | Status: DC
  Filled 2023-03-23 – 2023-05-03 (×2): qty 90, 90d supply, fill #0

## 2023-04-16 DIAGNOSIS — H5213 Myopia, bilateral: Secondary | ICD-10-CM | POA: Diagnosis not present

## 2023-05-03 ENCOUNTER — Other Ambulatory Visit: Payer: Self-pay

## 2023-05-03 ENCOUNTER — Other Ambulatory Visit (HOSPITAL_COMMUNITY): Payer: Self-pay

## 2023-07-12 ENCOUNTER — Other Ambulatory Visit (HOSPITAL_COMMUNITY): Payer: Self-pay

## 2023-07-12 MED ORDER — OLMESARTAN MEDOXOMIL-HCTZ 20-12.5 MG PO TABS
1.0000 | ORAL_TABLET | Freq: Every day | ORAL | 0 refills | Status: AC
  Filled 2023-07-12 – 2023-08-24 (×2): qty 90, 90d supply, fill #0

## 2023-07-13 ENCOUNTER — Other Ambulatory Visit (HOSPITAL_COMMUNITY): Payer: Self-pay

## 2023-07-13 ENCOUNTER — Other Ambulatory Visit: Payer: Self-pay

## 2023-07-13 MED ORDER — AMPHETAMINE-DEXTROAMPHET ER 30 MG PO CP24
30.0000 mg | ORAL_CAPSULE | Freq: Every morning | ORAL | 0 refills | Status: DC
  Filled 2023-07-13: qty 90, 90d supply, fill #0

## 2023-08-24 ENCOUNTER — Other Ambulatory Visit (HOSPITAL_COMMUNITY): Payer: Self-pay

## 2023-08-26 ENCOUNTER — Other Ambulatory Visit (HOSPITAL_COMMUNITY): Payer: Self-pay

## 2023-08-26 ENCOUNTER — Other Ambulatory Visit: Payer: Self-pay

## 2023-08-26 MED ORDER — HYDROCHLOROTHIAZIDE 12.5 MG PO TABS
12.5000 mg | ORAL_TABLET | Freq: Every day | ORAL | 3 refills | Status: AC
  Filled 2023-08-26: qty 90, 90d supply, fill #0

## 2023-09-17 ENCOUNTER — Other Ambulatory Visit (HOSPITAL_COMMUNITY): Payer: Self-pay

## 2023-09-17 ENCOUNTER — Other Ambulatory Visit: Payer: Self-pay

## 2023-09-17 ENCOUNTER — Other Ambulatory Visit (HOSPITAL_BASED_OUTPATIENT_CLINIC_OR_DEPARTMENT_OTHER): Payer: Self-pay

## 2023-09-17 MED ORDER — HYDROCHLOROTHIAZIDE 25 MG PO TABS
25.0000 mg | ORAL_TABLET | Freq: Every day | ORAL | 1 refills | Status: AC
  Filled 2023-09-17 – 2023-10-19 (×2): qty 90, 90d supply, fill #0
  Filled 2024-01-13: qty 90, 90d supply, fill #1

## 2023-09-17 MED ORDER — HYDROCHLOROTHIAZIDE 25 MG PO TABS
25.0000 mg | ORAL_TABLET | Freq: Every day | ORAL | 1 refills | Status: AC
  Filled 2023-09-17: qty 90, 90d supply, fill #0

## 2023-09-27 ENCOUNTER — Other Ambulatory Visit (HOSPITAL_BASED_OUTPATIENT_CLINIC_OR_DEPARTMENT_OTHER): Payer: Self-pay

## 2023-09-27 ENCOUNTER — Other Ambulatory Visit (HOSPITAL_COMMUNITY): Payer: Self-pay

## 2023-10-06 ENCOUNTER — Other Ambulatory Visit (HOSPITAL_BASED_OUTPATIENT_CLINIC_OR_DEPARTMENT_OTHER): Payer: Self-pay

## 2023-10-07 ENCOUNTER — Other Ambulatory Visit (HOSPITAL_BASED_OUTPATIENT_CLINIC_OR_DEPARTMENT_OTHER): Payer: Self-pay

## 2023-10-07 MED ORDER — AMPHETAMINE-DEXTROAMPHET ER 30 MG PO CP24
30.0000 mg | ORAL_CAPSULE | Freq: Every morning | ORAL | 0 refills | Status: DC
  Filled 2023-10-08: qty 90, 90d supply, fill #0

## 2023-10-08 ENCOUNTER — Other Ambulatory Visit (HOSPITAL_BASED_OUTPATIENT_CLINIC_OR_DEPARTMENT_OTHER): Payer: Self-pay

## 2023-10-11 ENCOUNTER — Other Ambulatory Visit (HOSPITAL_BASED_OUTPATIENT_CLINIC_OR_DEPARTMENT_OTHER): Payer: Self-pay

## 2023-10-13 ENCOUNTER — Other Ambulatory Visit (HOSPITAL_BASED_OUTPATIENT_CLINIC_OR_DEPARTMENT_OTHER): Payer: Self-pay

## 2023-10-13 MED ORDER — AMOXICILLIN 500 MG PO CAPS
500.0000 mg | ORAL_CAPSULE | Freq: Two times a day (BID) | ORAL | 0 refills | Status: AC
  Filled 2023-10-13: qty 14, 7d supply, fill #0

## 2023-10-15 DIAGNOSIS — N841 Polyp of cervix uteri: Secondary | ICD-10-CM | POA: Diagnosis not present

## 2023-10-15 DIAGNOSIS — Z01419 Encounter for gynecological examination (general) (routine) without abnormal findings: Secondary | ICD-10-CM | POA: Diagnosis not present

## 2023-10-15 DIAGNOSIS — Z6835 Body mass index (BMI) 35.0-35.9, adult: Secondary | ICD-10-CM | POA: Diagnosis not present

## 2023-10-18 ENCOUNTER — Other Ambulatory Visit (HOSPITAL_BASED_OUTPATIENT_CLINIC_OR_DEPARTMENT_OTHER): Payer: Self-pay

## 2023-10-18 MED ORDER — OLMESARTAN MEDOXOMIL-HCTZ 20-12.5 MG PO TABS
1.0000 | ORAL_TABLET | Freq: Every day | ORAL | 0 refills | Status: AC
  Filled 2023-10-18: qty 90, 90d supply, fill #0

## 2023-10-19 ENCOUNTER — Other Ambulatory Visit (HOSPITAL_BASED_OUTPATIENT_CLINIC_OR_DEPARTMENT_OTHER): Payer: Self-pay

## 2023-11-30 ENCOUNTER — Other Ambulatory Visit (HOSPITAL_BASED_OUTPATIENT_CLINIC_OR_DEPARTMENT_OTHER): Payer: Self-pay

## 2023-11-30 MED ORDER — FLUZONE 0.5 ML IM SUSY
0.5000 mL | PREFILLED_SYRINGE | Freq: Once | INTRAMUSCULAR | 0 refills | Status: AC
  Filled 2023-11-30: qty 0.5, 1d supply, fill #0

## 2023-12-17 ENCOUNTER — Other Ambulatory Visit (HOSPITAL_BASED_OUTPATIENT_CLINIC_OR_DEPARTMENT_OTHER): Payer: Self-pay

## 2023-12-17 MED ORDER — AMPHETAMINE-DEXTROAMPHET ER 30 MG PO CP24
30.0000 mg | ORAL_CAPSULE | Freq: Every morning | ORAL | 0 refills | Status: AC
  Filled 2023-12-17 – 2024-01-13 (×2): qty 90, 90d supply, fill #0

## 2023-12-17 MED ORDER — AMPHETAMINE-DEXTROAMPHET ER 30 MG PO CP24: 30.0000 mg | ORAL_CAPSULE | Freq: Every morning | ORAL | 0 refills | Status: AC

## 2023-12-17 MED ORDER — AZITHROMYCIN 500 MG PO TABS
500.0000 mg | ORAL_TABLET | Freq: Every day | ORAL | 0 refills | Status: AC
  Filled 2023-12-17: qty 3, 3d supply, fill #0

## 2024-01-13 ENCOUNTER — Other Ambulatory Visit (HOSPITAL_BASED_OUTPATIENT_CLINIC_OR_DEPARTMENT_OTHER): Payer: Self-pay
